# Patient Record
Sex: Female | Born: 1970
Health system: Southern US, Community
[De-identification: ages and names within clinical notes are randomized; demographics above are authoritative.]

## PROBLEM LIST (undated history)

## (undated) DIAGNOSIS — N3281 Overactive bladder: Secondary | ICD-10-CM

## (undated) DIAGNOSIS — N390 Urinary tract infection, site not specified: Secondary | ICD-10-CM

## (undated) DIAGNOSIS — E785 Hyperlipidemia, unspecified: Secondary | ICD-10-CM

## (undated) HISTORY — DX: Hyperlipidemia, unspecified: E78.5

## (undated) HISTORY — PX: OTHER SURGICAL HISTORY: SHX169

## (undated) HISTORY — DX: Overactive bladder: N32.81

## (undated) HISTORY — DX: Urinary tract infection, site not specified: N39.0

---

## 1993-10-24 LAB — HM PAP SMEAR

## 2012-10-24 LAB — HM MAMMOGRAPHY

## 2013-05-01 DIAGNOSIS — R351 Nocturia: Secondary | ICD-10-CM | POA: Diagnosis not present

## 2013-05-01 DIAGNOSIS — R32 Unspecified urinary incontinence: Secondary | ICD-10-CM | POA: Diagnosis not present

## 2013-05-01 DIAGNOSIS — R3129 Other microscopic hematuria: Secondary | ICD-10-CM | POA: Diagnosis not present

## 2013-05-08 DIAGNOSIS — Z79899 Other long term (current) drug therapy: Secondary | ICD-10-CM | POA: Diagnosis not present

## 2013-05-25 DIAGNOSIS — Z029 Encounter for administrative examinations, unspecified: Secondary | ICD-10-CM | POA: Diagnosis not present

## 2013-05-25 DIAGNOSIS — Z111 Encounter for screening for respiratory tuberculosis: Secondary | ICD-10-CM | POA: Diagnosis not present

## 2013-06-15 DIAGNOSIS — B351 Tinea unguium: Secondary | ICD-10-CM | POA: Diagnosis not present

## 2013-06-15 DIAGNOSIS — R262 Difficulty in walking, not elsewhere classified: Secondary | ICD-10-CM | POA: Diagnosis not present

## 2013-06-17 DIAGNOSIS — R3129 Other microscopic hematuria: Secondary | ICD-10-CM | POA: Diagnosis not present

## 2013-06-17 DIAGNOSIS — R32 Unspecified urinary incontinence: Secondary | ICD-10-CM | POA: Diagnosis not present

## 2013-06-18 DIAGNOSIS — Z124 Encounter for screening for malignant neoplasm of cervix: Secondary | ICD-10-CM | POA: Diagnosis not present

## 2013-06-18 DIAGNOSIS — Z1151 Encounter for screening for human papillomavirus (HPV): Secondary | ICD-10-CM | POA: Diagnosis not present

## 2013-06-18 DIAGNOSIS — Z01419 Encounter for gynecological examination (general) (routine) without abnormal findings: Secondary | ICD-10-CM | POA: Diagnosis not present

## 2013-07-24 DIAGNOSIS — R3129 Other microscopic hematuria: Secondary | ICD-10-CM | POA: Diagnosis not present

## 2013-07-30 DIAGNOSIS — R3129 Other microscopic hematuria: Secondary | ICD-10-CM | POA: Diagnosis not present

## 2013-07-30 DIAGNOSIS — K7689 Other specified diseases of liver: Secondary | ICD-10-CM | POA: Diagnosis not present

## 2013-07-30 DIAGNOSIS — N859 Noninflammatory disorder of uterus, unspecified: Secondary | ICD-10-CM | POA: Diagnosis not present

## 2013-07-30 DIAGNOSIS — N281 Cyst of kidney, acquired: Secondary | ICD-10-CM | POA: Diagnosis not present

## 2013-07-31 DIAGNOSIS — Z1231 Encounter for screening mammogram for malignant neoplasm of breast: Secondary | ICD-10-CM | POA: Diagnosis not present

## 2013-08-12 DIAGNOSIS — R319 Hematuria, unspecified: Secondary | ICD-10-CM | POA: Diagnosis not present

## 2013-09-14 DIAGNOSIS — R262 Difficulty in walking, not elsewhere classified: Secondary | ICD-10-CM | POA: Diagnosis not present

## 2013-09-14 DIAGNOSIS — B351 Tinea unguium: Secondary | ICD-10-CM | POA: Diagnosis not present

## 2013-10-14 DIAGNOSIS — R3129 Other microscopic hematuria: Secondary | ICD-10-CM | POA: Diagnosis not present

## 2013-10-14 DIAGNOSIS — F980 Enuresis not due to a substance or known physiological condition: Secondary | ICD-10-CM | POA: Diagnosis not present

## 2013-12-09 DIAGNOSIS — L299 Pruritus, unspecified: Secondary | ICD-10-CM | POA: Diagnosis not present

## 2014-02-03 DIAGNOSIS — N10 Acute tubulo-interstitial nephritis: Secondary | ICD-10-CM | POA: Diagnosis present

## 2014-02-03 DIAGNOSIS — Z466 Encounter for fitting and adjustment of urinary device: Secondary | ICD-10-CM | POA: Diagnosis not present

## 2014-02-03 DIAGNOSIS — J302 Other seasonal allergic rhinitis: Secondary | ICD-10-CM | POA: Diagnosis present

## 2014-02-03 DIAGNOSIS — N1339 Other hydronephrosis: Secondary | ICD-10-CM | POA: Diagnosis not present

## 2014-02-03 DIAGNOSIS — F70 Mild intellectual disabilities: Secondary | ICD-10-CM | POA: Diagnosis present

## 2014-02-03 DIAGNOSIS — N3090 Cystitis, unspecified without hematuria: Secondary | ICD-10-CM | POA: Diagnosis present

## 2014-02-03 DIAGNOSIS — R569 Unspecified convulsions: Secondary | ICD-10-CM | POA: Diagnosis present

## 2014-02-03 DIAGNOSIS — E876 Hypokalemia: Secondary | ICD-10-CM | POA: Diagnosis present

## 2014-02-03 DIAGNOSIS — D72829 Elevated white blood cell count, unspecified: Secondary | ICD-10-CM | POA: Diagnosis not present

## 2014-02-03 DIAGNOSIS — E780 Pure hypercholesterolemia: Secondary | ICD-10-CM | POA: Diagnosis present

## 2014-02-03 DIAGNOSIS — B9620 Unspecified Escherichia coli [E. coli] as the cause of diseases classified elsewhere: Secondary | ICD-10-CM | POA: Diagnosis present

## 2014-02-03 DIAGNOSIS — Z9889 Other specified postprocedural states: Secondary | ICD-10-CM | POA: Diagnosis not present

## 2014-02-03 DIAGNOSIS — N3000 Acute cystitis without hematuria: Secondary | ICD-10-CM | POA: Diagnosis not present

## 2014-02-03 DIAGNOSIS — N12 Tubulo-interstitial nephritis, not specified as acute or chronic: Secondary | ICD-10-CM | POA: Diagnosis not present

## 2014-02-03 DIAGNOSIS — L299 Pruritus, unspecified: Secondary | ICD-10-CM | POA: Diagnosis not present

## 2014-02-03 DIAGNOSIS — N390 Urinary tract infection, site not specified: Secondary | ICD-10-CM | POA: Diagnosis not present

## 2014-02-03 DIAGNOSIS — R1012 Left upper quadrant pain: Secondary | ICD-10-CM | POA: Diagnosis not present

## 2014-02-03 DIAGNOSIS — Z87440 Personal history of urinary (tract) infections: Secondary | ICD-10-CM | POA: Diagnosis not present

## 2014-02-03 DIAGNOSIS — N852 Hypertrophy of uterus: Secondary | ICD-10-CM | POA: Diagnosis not present

## 2014-02-03 DIAGNOSIS — R0602 Shortness of breath: Secondary | ICD-10-CM | POA: Diagnosis not present

## 2014-02-03 DIAGNOSIS — I10 Essential (primary) hypertension: Secondary | ICD-10-CM | POA: Diagnosis not present

## 2014-02-03 DIAGNOSIS — A419 Sepsis, unspecified organism: Secondary | ICD-10-CM | POA: Diagnosis not present

## 2014-02-03 DIAGNOSIS — R1032 Left lower quadrant pain: Secondary | ICD-10-CM | POA: Diagnosis not present

## 2014-02-03 DIAGNOSIS — A4151 Sepsis due to Escherichia coli [E. coli]: Secondary | ICD-10-CM | POA: Diagnosis not present

## 2014-02-03 DIAGNOSIS — E785 Hyperlipidemia, unspecified: Secondary | ICD-10-CM | POA: Diagnosis present

## 2014-02-03 DIAGNOSIS — R312 Other microscopic hematuria: Secondary | ICD-10-CM | POA: Diagnosis present

## 2014-02-03 DIAGNOSIS — D259 Leiomyoma of uterus, unspecified: Secondary | ICD-10-CM | POA: Diagnosis present

## 2014-02-03 DIAGNOSIS — N200 Calculus of kidney: Secondary | ICD-10-CM | POA: Diagnosis not present

## 2014-02-03 DIAGNOSIS — R1937 Generalized abdominal rigidity: Secondary | ICD-10-CM | POA: Diagnosis not present

## 2014-02-03 DIAGNOSIS — Z743 Need for continuous supervision: Secondary | ICD-10-CM | POA: Diagnosis not present

## 2014-02-03 DIAGNOSIS — R3915 Urgency of urination: Secondary | ICD-10-CM | POA: Diagnosis not present

## 2014-02-03 DIAGNOSIS — R109 Unspecified abdominal pain: Secondary | ICD-10-CM | POA: Diagnosis not present

## 2014-02-03 DIAGNOSIS — R197 Diarrhea, unspecified: Secondary | ICD-10-CM | POA: Diagnosis not present

## 2014-02-03 DIAGNOSIS — N1330 Unspecified hydronephrosis: Secondary | ICD-10-CM | POA: Diagnosis not present

## 2014-02-03 DIAGNOSIS — D649 Anemia, unspecified: Secondary | ICD-10-CM | POA: Diagnosis present

## 2014-02-03 DIAGNOSIS — N289 Disorder of kidney and ureter, unspecified: Secondary | ICD-10-CM | POA: Diagnosis not present

## 2014-03-01 DIAGNOSIS — N12 Tubulo-interstitial nephritis, not specified as acute or chronic: Secondary | ICD-10-CM | POA: Diagnosis not present

## 2014-03-01 DIAGNOSIS — Z09 Encounter for follow-up examination after completed treatment for conditions other than malignant neoplasm: Secondary | ICD-10-CM | POA: Diagnosis not present

## 2014-03-12 DIAGNOSIS — N1330 Unspecified hydronephrosis: Secondary | ICD-10-CM | POA: Diagnosis not present

## 2014-03-12 DIAGNOSIS — N39498 Other specified urinary incontinence: Secondary | ICD-10-CM | POA: Diagnosis not present

## 2014-04-16 DIAGNOSIS — R262 Difficulty in walking, not elsewhere classified: Secondary | ICD-10-CM | POA: Diagnosis not present

## 2014-04-16 DIAGNOSIS — B351 Tinea unguium: Secondary | ICD-10-CM | POA: Diagnosis not present

## 2014-04-16 DIAGNOSIS — L97521 Non-pressure chronic ulcer of other part of left foot limited to breakdown of skin: Secondary | ICD-10-CM | POA: Diagnosis not present

## 2014-05-10 DIAGNOSIS — Z79899 Other long term (current) drug therapy: Secondary | ICD-10-CM | POA: Diagnosis not present

## 2014-05-17 DIAGNOSIS — N1330 Unspecified hydronephrosis: Secondary | ICD-10-CM | POA: Diagnosis not present

## 2014-05-27 DIAGNOSIS — Z3042 Encounter for surveillance of injectable contraceptive: Secondary | ICD-10-CM | POA: Diagnosis not present

## 2014-05-31 DIAGNOSIS — E782 Mixed hyperlipidemia: Secondary | ICD-10-CM | POA: Diagnosis not present

## 2014-05-31 DIAGNOSIS — L299 Pruritus, unspecified: Secondary | ICD-10-CM | POA: Diagnosis not present

## 2014-05-31 DIAGNOSIS — Z111 Encounter for screening for respiratory tuberculosis: Secondary | ICD-10-CM | POA: Diagnosis not present

## 2014-05-31 DIAGNOSIS — L853 Xerosis cutis: Secondary | ICD-10-CM | POA: Diagnosis not present

## 2014-06-25 LAB — HM PAP SMEAR

## 2014-07-01 DIAGNOSIS — E782 Mixed hyperlipidemia: Secondary | ICD-10-CM | POA: Diagnosis not present

## 2014-07-01 DIAGNOSIS — Z01419 Encounter for gynecological examination (general) (routine) without abnormal findings: Secondary | ICD-10-CM | POA: Diagnosis not present

## 2014-07-01 LAB — LIPID PANEL
Cholesterol: 171 mg/dL (ref 0–200)
Cholesterol: 171 mg/dL (ref 0–200)
HDL: 52 mg/dL (ref 35–70)
HDL: 52 mg/dL (ref 35–70)
LDL Cholesterol: 110 mg/dL
LDL Cholesterol: 110 mg/dL
Triglycerides: 47 mg/dL (ref 40–160)

## 2014-07-01 LAB — HEPATIC FUNCTION PANEL
ALT: 13 U/L (ref 7–35)
ALT: 13 U/L (ref 7–35)
AST: 16 U/L (ref 13–35)
AST: 16 U/L (ref 13–35)
Alkaline Phosphatase: 74 U/L (ref 25–125)
Alkaline Phosphatase: 74 U/L (ref 25–125)
Bilirubin, Total: 1.4 mg/dL

## 2014-07-01 LAB — BASIC METABOLIC PANEL
BUN: 11 mg/dL (ref 4–21)
Creatinine: 1.2 mg/dL — AB (ref <=1.1)
Glucose: 102 mg/dL
Potassium: 4.1 mmol/L (ref 3.4–5.3)
Sodium: 141 mmol/L (ref 137–147)

## 2014-07-01 LAB — CBC AND DIFFERENTIAL
HCT: 38 % (ref 36–46)
HCT: 38 % (ref 36–46)
Hemoglobin: 12.6 g/dL (ref 12.0–16.0)
Hemoglobin: 12.6 g/dL (ref 12.0–16.0)
Platelets: 268 10*3/uL (ref 150–399)
Platelets: 268 10*3/uL (ref 150–399)
WBC: 5.7 10^3/mL
WBC: 5.7 10^3/mL

## 2014-07-23 DIAGNOSIS — R262 Difficulty in walking, not elsewhere classified: Secondary | ICD-10-CM | POA: Diagnosis not present

## 2014-07-23 DIAGNOSIS — B351 Tinea unguium: Secondary | ICD-10-CM | POA: Diagnosis not present

## 2014-08-12 DIAGNOSIS — Z3042 Encounter for surveillance of injectable contraceptive: Secondary | ICD-10-CM | POA: Diagnosis not present

## 2014-08-20 DIAGNOSIS — Z1231 Encounter for screening mammogram for malignant neoplasm of breast: Secondary | ICD-10-CM | POA: Diagnosis not present

## 2014-09-01 DIAGNOSIS — L299 Pruritus, unspecified: Secondary | ICD-10-CM | POA: Diagnosis not present

## 2014-09-24 DIAGNOSIS — R312 Other microscopic hematuria: Secondary | ICD-10-CM | POA: Diagnosis not present

## 2014-09-24 DIAGNOSIS — N39498 Other specified urinary incontinence: Secondary | ICD-10-CM | POA: Diagnosis not present

## 2014-09-24 DIAGNOSIS — R351 Nocturia: Secondary | ICD-10-CM | POA: Diagnosis not present

## 2014-10-28 DIAGNOSIS — B351 Tinea unguium: Secondary | ICD-10-CM | POA: Diagnosis not present

## 2014-10-28 DIAGNOSIS — R262 Difficulty in walking, not elsewhere classified: Secondary | ICD-10-CM | POA: Diagnosis not present

## 2014-11-04 DIAGNOSIS — Z3042 Encounter for surveillance of injectable contraceptive: Secondary | ICD-10-CM | POA: Diagnosis not present

## 2014-11-26 DIAGNOSIS — R350 Frequency of micturition: Secondary | ICD-10-CM | POA: Diagnosis not present

## 2014-11-26 DIAGNOSIS — N39498 Other specified urinary incontinence: Secondary | ICD-10-CM | POA: Diagnosis not present

## 2014-12-30 DIAGNOSIS — B351 Tinea unguium: Secondary | ICD-10-CM | POA: Diagnosis not present

## 2014-12-30 DIAGNOSIS — R262 Difficulty in walking, not elsewhere classified: Secondary | ICD-10-CM | POA: Diagnosis not present

## 2015-03-02 ENCOUNTER — Encounter: Payer: Self-pay | Admitting: *Deleted

## 2015-03-03 ENCOUNTER — Encounter: Payer: Self-pay | Admitting: *Deleted

## 2015-03-03 ENCOUNTER — Ambulatory Visit (INDEPENDENT_AMBULATORY_CARE_PROVIDER_SITE_OTHER): Payer: Medicare Other | Admitting: Nurse Practitioner

## 2015-03-03 ENCOUNTER — Encounter: Payer: Self-pay | Admitting: Nurse Practitioner

## 2015-03-03 VITALS — BP 142/80 | HR 104 | Temp 98.1°F | Resp 20 | Ht 63.78 in | Wt 189.8 lb

## 2015-03-03 DIAGNOSIS — E785 Hyperlipidemia, unspecified: Secondary | ICD-10-CM

## 2015-03-03 DIAGNOSIS — F79 Unspecified intellectual disabilities: Secondary | ICD-10-CM | POA: Diagnosis not present

## 2015-03-03 DIAGNOSIS — N3281 Overactive bladder: Secondary | ICD-10-CM

## 2015-03-03 DIAGNOSIS — D509 Iron deficiency anemia, unspecified: Secondary | ICD-10-CM | POA: Diagnosis not present

## 2015-03-03 DIAGNOSIS — J309 Allergic rhinitis, unspecified: Secondary | ICD-10-CM | POA: Diagnosis not present

## 2015-03-03 MED ORDER — MEDROXYPROGESTERONE ACETATE 150 MG/ML IM SUSP: 150.0000 mg | INTRAMUSCULAR | Status: DC

## 2015-03-03 MED ORDER — LORATADINE 10 MG PO TABS: 10.0000 mg | ORAL_TABLET | Freq: Every day | ORAL | Status: DC | PRN

## 2015-03-03 MED ORDER — SOLIFENACIN SUCCINATE 5 MG PO TABS: 5.0000 mg | ORAL_TABLET | Freq: Every day | ORAL | Status: DC

## 2015-03-03 MED ORDER — FERROUS SULFATE 325 (65 FE) MG PO TABS: 325.0000 mg | ORAL_TABLET | Freq: Three times a day (TID) | ORAL | Status: DC

## 2015-03-03 MED ORDER — PRAVASTATIN SODIUM 40 MG PO TABS: 40.0000 mg | ORAL_TABLET | Freq: Every day | ORAL | Status: DC

## 2015-03-03 NOTE — Patient Instructions (Signed)
Follow up in 4 weeks for extended visit-  With fasting lab work prior to visit

## 2015-03-03 NOTE — Progress Notes (Signed)
Patient ID: Christine Terrell, female   DOB: Mar 12, 1971, 44 y.o.   MRN: [id number]    PCP: Lauree Chandler, NP  Advanced Directive information Does patient have an advance directive?: Yes, Type of Advance Directive: Healthcare Power of Attorney  No Known Allergies  Chief Complaint  Patient presents with  . Establish Care    mother in attendance  . Medical Management of Chronic Issues     HPI: Patient is a 44 y.o. female seen in the office today to establish care. Pt with pmh of itching, hyperlipidemia, seasonal allergies, intellectual disability  Pt here with mother. Pt previously living in a supervised home in [place]. Mother moved to [facility] and pt moved with her last month. Pt currently lives with mother at this time.  Uses Claritin as needed for allergies Eating heart heathy diet Getting evaluated by psychologist due intellectual disability, this was the diagnosed give to her as a child.  No acute issues today has complaints.   Review of Systems:  Review of Systems  Constitutional: Negative for activity change, appetite change, fatigue and unexpected weight change.  HENT: Negative for congestion and hearing loss.   Eyes: Negative.   Respiratory: Negative for cough and shortness of breath.   Cardiovascular: Negative for chest pain, palpitations and leg swelling.  Gastrointestinal: Negative for abdominal pain, diarrhea and constipation.  Genitourinary: Negative for dysuria and difficulty urinating.  Musculoskeletal: Negative for myalgias and arthralgias.  Skin: Negative for color change and wound.  Neurological: Negative for dizziness and weakness.  Psychiatric/Behavioral: Negative for behavioral problems, confusion and agitation.    Past Medical History  Diagnosis Date  . Hyperlipidemia   . Recurrent UTI   . Overactive bladder    Past Surgical History  Procedure Laterality Date  . Uretral stent     Social History:   reports that she has never smoked. She has never used  smokeless tobacco. She reports that she does not drink alcohol or use illicit drugs.  Family History  Problem Relation Age of Onset  . Hypertension Mother   . Arthritis Mother   . Cancer Father 70    stomach    Medications: Patient's Medications  New Prescriptions   LORATADINE (CLARITIN) 10 MG TABLET    Take 1 tablet (10 mg total) by mouth daily as needed for allergies.   MEDROXYPROGESTERONE (DEPO-PROVERA) 150 MG/ML INJECTION    Inject 1 mL (150 mg total) into the muscle every 3 (three) months.  Previous Medications   CHOLECALCIFEROL (D3 SUPER STRENGTH) 2000 UNITS CAPS    Take 2,000 Units by mouth daily.   MULTIVITAMIN-IRON-MINERALS-FOLIC ACID (CENTRUM) CHEWABLE TABLET    Chew 2 tablets by mouth daily.  Modified Medications   Modified Medication Previous Medication   FERROUS SULFATE 325 (65 FE) MG TABLET ferrous sulfate 325 (65 FE) MG tablet      Take 1 tablet (325 mg total) by mouth 3 (three) times daily with meals.    Take 325 mg by mouth 3 (three) times daily with meals.   PRAVASTATIN (PRAVACHOL) 40 MG TABLET pravastatin (PRAVACHOL) 40 MG tablet      Take 1 tablet (40 mg total) by mouth daily.    Take 40 mg by mouth daily.   SOLIFENACIN (VESICARE) 5 MG TABLET solifenacin (VESICARE) 5 MG tablet      Take 1 tablet (5 mg total) by mouth daily.    Take 5 mg by mouth daily.  Discontinued Medications   No medications on file  Physical Exam:  Filed Vitals:   03/03/15 0902  BP: 142/80  Pulse: 104  Temp: 98.1 F (36.7 C)  TempSrc: Oral  Resp: 20  Height: 5' 3.78" (1.62 m)  Weight: 189 lb 12.8 oz (86.093 kg)  SpO2: 99%   Body mass index is 32.8 kg/(m^2).  Physical Exam  Constitutional: She is oriented to person, place, and time. She appears well-developed and well-nourished. No distress.  HENT:  Head: Normocephalic and atraumatic.  Mouth/Throat: Oropharynx is clear and moist. No oropharyngeal exudate.  Eyes: Conjunctivae are normal. Pupils are equal, round, and  reactive to light.  Neck: Normal range of motion. Neck supple.  Cardiovascular: Normal rate, regular rhythm and normal heart sounds.   Pulmonary/Chest: Effort normal and breath sounds normal.  Abdominal: Soft. Bowel sounds are normal.  Musculoskeletal: She exhibits no edema or tenderness.  Neurological: She is alert and oriented to person, place, and time.  Skin: Skin is warm and dry. She is not diaphoretic.  Psychiatric: She has a normal mood and affect.    Labs reviewed: Basic Metabolic Panel: No results for input(s): NA, K, CL, CO2, GLUCOSE, BUN, CREATININE, CALCIUM, MG, PHOS, TSH in the last 8760 hours. Liver Function Tests: No results for input(s): AST, ALT, ALKPHOS, BILITOT, PROT, ALBUMIN in the last 8760 hours. No results for input(s): LIPASE, AMYLASE in the last 8760 hours. No results for input(s): AMMONIA in the last 8760 hours. CBC: No results for input(s): WBC, NEUTROABS, HGB, HCT, MCV, PLT in the last 8760 hours. Lipid Panel: No results for input(s): CHOL, HDL, LDLCALC, TRIG, CHOLHDL, LDLDIRECT in the last 8760 hours. TSH: No results for input(s): TSH in the last 8760 hours. A1C: No results found for: HGBA1C   Assessment/Plan 1. Anemia, iron deficiency - CBC with Differential; Future to follow up anemia - ferrous sulfate 325 (65 FE) MG tablet; Take 1 tablet (325 mg total) by mouth 3 (three) times daily with meals.  Dispense: 90 tablet; Refill: 3  2. Overactive bladder -maintained on vesicare - solifenacin (VESICARE) 5 MG tablet; Take 1 tablet (5 mg total) by mouth daily.  Dispense: 30 tablet; Refill: 3  3. Hyperlipidemia -LDL elevated on last lab in April  -conts on pravachol  - Comprehensive metabolic panel; Future - Lipid panel; Future - pravastatin (PRAVACHOL) 40 MG tablet; Take 1 tablet (40 mg total) by mouth daily.  Dispense: 30 tablet; Refill: 3  4. Intellectual disability -previously at home for disabled, now living with mother and she is attempting to  get her into programs locally since they have moved. Has evaluation with psychologist next week, needs referral  - Ambulatory referral to Psychology  5. Allergic rhinitis, unspecified allergic rhinitis type -seasonal allergies, takes as needed -no problems at this time.  - loratadine (CLARITIN) 10 MG tablet; Take 1 tablet (10 mg total) by mouth daily as needed for allergies.  Follow up in 4 weeks for EV with lab work prior to visit, had last physical in April so will do extended visit but not physical   Janett Billow K. Harle Battiest  [facility] & Adult Medicine [phone] 8 am - 5 pm) [phone] (after hours)

## 2015-03-10 DIAGNOSIS — F71 Moderate intellectual disabilities: Secondary | ICD-10-CM | POA: Diagnosis not present

## 2015-03-30 ENCOUNTER — Other Ambulatory Visit: Payer: Medicare Other

## 2015-03-30 DIAGNOSIS — E785 Hyperlipidemia, unspecified: Secondary | ICD-10-CM | POA: Diagnosis not present

## 2015-03-30 DIAGNOSIS — D509 Iron deficiency anemia, unspecified: Secondary | ICD-10-CM

## 2015-03-31 ENCOUNTER — Ambulatory Visit (INDEPENDENT_AMBULATORY_CARE_PROVIDER_SITE_OTHER): Payer: Medicare Other | Admitting: Nurse Practitioner

## 2015-03-31 ENCOUNTER — Encounter: Payer: Self-pay | Admitting: Nurse Practitioner

## 2015-03-31 VITALS — BP 146/84 | HR 96 | Temp 98.4°F | Resp 12 | Ht 64.0 in | Wt 183.0 lb

## 2015-03-31 DIAGNOSIS — Z308 Encounter for other contraceptive management: Secondary | ICD-10-CM | POA: Diagnosis not present

## 2015-03-31 DIAGNOSIS — D509 Iron deficiency anemia, unspecified: Secondary | ICD-10-CM | POA: Diagnosis not present

## 2015-03-31 DIAGNOSIS — R030 Elevated blood-pressure reading, without diagnosis of hypertension: Secondary | ICD-10-CM | POA: Diagnosis not present

## 2015-03-31 DIAGNOSIS — R739 Hyperglycemia, unspecified: Secondary | ICD-10-CM

## 2015-03-31 DIAGNOSIS — F79 Unspecified intellectual disabilities: Secondary | ICD-10-CM

## 2015-03-31 DIAGNOSIS — E785 Hyperlipidemia, unspecified: Secondary | ICD-10-CM

## 2015-03-31 DIAGNOSIS — Z23 Encounter for immunization: Secondary | ICD-10-CM | POA: Diagnosis not present

## 2015-03-31 LAB — COMPREHENSIVE METABOLIC PANEL
ALT: 12 IU/L (ref 0–32)
AST: 11 IU/L (ref 0–40)
Albumin/Globulin Ratio: 1.5 (ref 1.1–2.5)
Albumin: 4.2 g/dL (ref 3.5–5.5)
Alkaline Phosphatase: 79 IU/L (ref 39–117)
BUN/Creatinine Ratio: 9 (ref 9–23)
BUN: 11 mg/dL (ref 6–24)
Bilirubin Total: 1.7 mg/dL — ABNORMAL HIGH (ref 0.0–1.2)
CO2: 22 mmol/L (ref 18–29)
Calcium: 10 mg/dL (ref 8.7–10.2)
Chloride: 103 mmol/L (ref 96–106)
Creatinine, Ser: 1.23 mg/dL — ABNORMAL HIGH (ref 0.57–1.00)
GFR calc Af Amer: 63 mL/min/{1.73_m2} (ref >59)
GFR calc non Af Amer: 54 mL/min/{1.73_m2} — ABNORMAL LOW (ref >59)
Globulin, Total: 2.8 g/dL (ref 1.5–4.5)
Glucose: 132 mg/dL — ABNORMAL HIGH (ref 65–99)
Potassium: 3.6 mmol/L (ref 3.5–5.2)
Sodium: 142 mmol/L (ref 134–144)
Total Protein: 7 g/dL (ref 6.0–8.5)

## 2015-03-31 LAB — LIPID PANEL
Chol/HDL Ratio: 3.5 ratio units (ref 0.0–4.4)
Cholesterol, Total: 173 mg/dL (ref 100–199)
HDL: 49 mg/dL (ref >39)
LDL Calculated: 113 mg/dL — ABNORMAL HIGH (ref 0–99)
Triglycerides: 54 mg/dL (ref 0–149)
VLDL Cholesterol Cal: 11 mg/dL (ref 5–40)

## 2015-03-31 LAB — CBC WITH DIFFERENTIAL/PLATELET
Basophils Absolute: 0 10*3/uL (ref 0.0–0.2)
Basos: 0 %
EOS (ABSOLUTE): 0 10*3/uL (ref 0.0–0.4)
Eos: 0 %
Hematocrit: 38.8 % (ref 34.0–46.6)
Hemoglobin: 12.9 g/dL (ref 11.1–15.9)
Immature Grans (Abs): 0 10*3/uL (ref 0.0–0.1)
Immature Granulocytes: 0 %
Lymphocytes Absolute: 1.9 10*3/uL (ref 0.7–3.1)
Lymphs: 35 %
MCH: 35.4 pg — ABNORMAL HIGH (ref 26.6–33.0)
MCHC: 33.2 g/dL (ref 31.5–35.7)
MCV: 107 fL — ABNORMAL HIGH (ref 79–97)
Monocytes Absolute: 0.6 10*3/uL (ref 0.1–0.9)
Monocytes: 10 %
Neutrophils Absolute: 3 10*3/uL (ref 1.4–7.0)
Neutrophils: 55 %
Platelets: 266 10*3/uL (ref 150–379)
RBC: 3.64 x10E6/uL — ABNORMAL LOW (ref 3.77–5.28)
RDW: 13.1 % (ref 12.3–15.4)
WBC: 5.6 10*3/uL (ref 3.4–10.8)

## 2015-03-31 NOTE — Patient Instructions (Addendum)
Will refer to gyn  Stop iron- will recheck blood prior to next visit   To follow up in 3 months with Dr Eulas Post with blood work prior to visit   Follow up with Janett Billow in 6 months for physical   Blood pressure is borderline high, 3 readings over 140/80 is hypertension Low sodium diet Increase exercise to help with this May take blood pressure outside of office and record. Bring to next visit

## 2015-03-31 NOTE — Progress Notes (Signed)
Patient ID: Christine Terrell, female   DOB: [birth year], 45 y.o.   MRN: [id number]    PCP: Lauree Chandler, NP  No Known Allergies  Chief Complaint  Patient presents with  . Referral    GYN referral   . Immunizations    Flu vacine today, refused TDaP   . Medical Management of Chronic Issues    copy of labs printed     HPI: Patient is a 45 y.o. female seen in the office today for routine follow up. Pt with pmh of itching, hyperlipidemia, seasonal allergies, intellectual disability  Pt here with mother. Last physical in April. Needs referral to GYN, light bleeding today.  Mother reports she has changed her diet, more fruits and vegetables. Eating more whole grains. Low fat diet.   Review of Systems:  Review of Systems  Constitutional: Negative for activity change, appetite change, fatigue and unexpected weight change.  HENT: Negative for congestion and hearing loss.   Eyes: Negative.   Respiratory: Negative for cough and shortness of breath.   Cardiovascular: Negative for chest pain, palpitations and leg swelling.  Gastrointestinal: Negative for abdominal pain, diarrhea and constipation.  Genitourinary: Negative for dysuria and difficulty urinating.  Musculoskeletal: Negative for myalgias and arthralgias.  Skin: Negative for color change and wound.  Neurological: Negative for dizziness and weakness.  Psychiatric/Behavioral: Negative for behavioral problems, confusion and agitation.    Past Medical History  Diagnosis Date  . Hyperlipidemia   . Recurrent UTI   . Overactive bladder    Past Surgical History  Procedure Laterality Date  . Uretral stent     Social History:   reports that she has never smoked. She has never used smokeless tobacco. She reports that she does not drink alcohol or use illicit drugs.  Family History  Problem Relation Age of Onset  . Hypertension Mother   . Arthritis Mother   . Cancer Father 53    stomach    Medications: Patient's Medications    New Prescriptions   No medications on file  Previous Medications   CHOLECALCIFEROL (D3 SUPER STRENGTH) 2000 UNITS CAPS    Take 2,000 Units by mouth daily.   FERROUS SULFATE 325 (65 FE) MG TABLET    Take 1 tablet (325 mg total) by mouth 3 (three) times daily with meals.   MEDROXYPROGESTERONE (DEPO-PROVERA) 150 MG/ML INJECTION    Inject 1 mL (150 mg total) into the muscle every 3 (three) months.   MULTIVITAMIN-IRON-MINERALS-FOLIC ACID (CENTRUM) CHEWABLE TABLET    Chew 2 tablets by mouth daily.   PRAVASTATIN (PRAVACHOL) 40 MG TABLET    Take 1 tablet (40 mg total) by mouth daily.   SOLIFENACIN (VESICARE) 5 MG TABLET    Take 1 tablet (5 mg total) by mouth daily.  Modified Medications   No medications on file  Discontinued Medications   LORATADINE (CLARITIN) 10 MG TABLET    Take 1 tablet (10 mg total) by mouth daily as needed for allergies.     Physical Exam:  Filed Vitals:   03/31/15 1342  BP: 146/84  Pulse: 96  Temp: 98.4 F (36.9 C)  TempSrc: Oral  Resp: 12  Height: 5\' 4"  (1.626 m)  Weight: 183 lb (83.008 kg)  SpO2: 97%   Body mass index is 31.4 kg/(m^2).  Physical Exam  Constitutional: She is oriented to person, place, and time. She appears well-developed and well-nourished. No distress.  HENT:  Head: Normocephalic and atraumatic.  Mouth/Throat: Oropharynx is clear and moist. No oropharyngeal exudate.  Eyes: Conjunctivae are normal. Pupils are equal, round, and reactive to light.  Neck: Normal range of motion. Neck supple.  Cardiovascular: Normal rate, regular rhythm and normal heart sounds.   Pulmonary/Chest: Effort normal and breath sounds normal.  Abdominal: Soft. Bowel sounds are normal.  Musculoskeletal: She exhibits no edema or tenderness.  Neurological: She is alert and oriented to person, place, and time.  Skin: Skin is warm and dry. She is not diaphoretic.  Psychiatric: She has a normal mood and affect.    Labs reviewed: Basic Metabolic Panel:  Recent  Labs  07/01/14 03/30/15 0851  NA 141 142  K 4.1 3.6  CL  --  103  CO2  --  22  GLUCOSE  --  132*  BUN 11 11  CREATININE 1.2* 1.23*  CALCIUM  --  10.0   Liver Function Tests:  Recent Labs  07/01/14 03/30/15 0851  AST 16 11  ALT 13 12  ALKPHOS 74 79  BILITOT  --  1.7*  PROT  --  7.0  ALBUMIN  --  4.2   No results for input(s): LIPASE, AMYLASE in the last 8760 hours. No results for input(s): AMMONIA in the last 8760 hours. CBC:  Recent Labs  07/01/14 03/30/15 0851  WBC 5.7 5.6  NEUTROABS  --  3.0  HGB 12.6  --   HCT 38 38.8  PLT 268  --    Lipid Panel:  Recent Labs  07/01/14 03/30/15 0851  CHOL 171 173  HDL 52 49  LDLCALC 110 113*  TRIG 47 54  CHOLHDL  --  3.5   TSH: No results for input(s): TSH in the last 8760 hours. A1C: No results found for: HGBA1C   Assessment/Plan  1. Encounter for other contraceptive management - Ambulatory referral to Gynecology for cervical cancer screening and management of depo-provera  2. Borderline hypertension -blood pressure may be elevated due to anxiety from being at doctors office, to take blood pressure at home and record. Bring to next visit -low sodium diet and increase physical activity.   3. Anemia, iron deficiency hgb stable, will stop iron at this time and follow up CBC prior to next visit.  - CBC with Differential; Future  4. Hyperlipidemia -worsening LDL but stable, heart healthy diet, and to cont Pravachol  - Comprehensive metabolic panel; Future - Lipid panel; Future  5. Intellectual disability -remains stable, living with mother, looking into a day program  6. Hyperglycemia -elevated blood sugar on recent labs, diet changes have been made, to lifestyle modifications - Hemoglobin A1c; Future    Patty Leitzke K. Harle Battiest  [facility] & Adult Medicine [phone] 8 am - 5 pm) [phone] (after hours)

## 2015-04-05 ENCOUNTER — Telehealth: Payer: Self-pay | Admitting: *Deleted

## 2015-04-05 NOTE — Telephone Encounter (Signed)
Mother stated that she is doing all this already and wants to know why you don't think it is coming from the flu shot. She stated that her daughter was fine when she came in and when she got the flu shot that is  when the symptoms started. Mother can't understand why you are saying this because you injected her with the flu and it is a possibility that this can cause these symptoms. Mother doesn't want a response back, will continue the Mucinex and Tylenol.

## 2015-04-05 NOTE — Telephone Encounter (Signed)
Theres nothing that can be called in, sounds viral but doubtful from influenza vaccine.  may use tylenol 650 mg every 6 hours as needed for sore throat, mucinex DM twice daily with full glass of water for cough and congestion. To really encourage increase in fluids May use warm fluids to help with sore throat.

## 2015-04-05 NOTE — Telephone Encounter (Signed)
Patient mother, Christine Terrell called and stated that her daughter has been sick since she received the flu shot Thursday and wants something called in for her to the pharmacy. Patient is having Hacking Cough, sneezing, sore throat, runny nose, congestion, no fever. Has tried OTC Cold medications with no relief. Please Advise.

## 2015-04-11 DIAGNOSIS — N920 Excessive and frequent menstruation with regular cycle: Secondary | ICD-10-CM | POA: Diagnosis not present

## 2015-04-11 DIAGNOSIS — Z30013 Encounter for initial prescription of injectable contraceptive: Secondary | ICD-10-CM | POA: Diagnosis not present

## 2015-05-04 DIAGNOSIS — F71 Moderate intellectual disabilities: Secondary | ICD-10-CM | POA: Diagnosis not present

## 2015-05-17 ENCOUNTER — Encounter: Payer: Self-pay | Admitting: *Deleted

## 2015-05-20 ENCOUNTER — Telehealth: Payer: Self-pay

## 2015-05-20 MED ORDER — LORATADINE 10 MG PO TABS: 10.0000 mg | ORAL_TABLET | Freq: Every day | ORAL | Status: DC

## 2015-05-20 NOTE — Telephone Encounter (Signed)
Discussed with patient's mother. RX sent to the pharmacy

## 2015-05-20 NOTE — Telephone Encounter (Signed)
Would not recommend her taking Claritin D every day, okay for Rx for claritin 10 mg daily

## 2015-05-20 NOTE — Telephone Encounter (Signed)
Patient is taking Claritin D OTC once daily and would like to know if she can get a rx for this so that insurance will pay for it.  Please advise

## 2015-06-13 ENCOUNTER — Other Ambulatory Visit: Payer: Self-pay | Admitting: Nurse Practitioner

## 2015-07-04 ENCOUNTER — Other Ambulatory Visit: Payer: Medicare Other

## 2015-07-04 DIAGNOSIS — R739 Hyperglycemia, unspecified: Secondary | ICD-10-CM | POA: Diagnosis not present

## 2015-07-04 DIAGNOSIS — E785 Hyperlipidemia, unspecified: Secondary | ICD-10-CM | POA: Diagnosis not present

## 2015-07-04 DIAGNOSIS — D509 Iron deficiency anemia, unspecified: Secondary | ICD-10-CM

## 2015-07-05 LAB — CBC WITH DIFFERENTIAL/PLATELET
Basophils Absolute: 0 10*3/uL (ref 0.0–0.2)
Basos: 0 %
EOS (ABSOLUTE): 0 10*3/uL (ref 0.0–0.4)
Eos: 0 %
Hematocrit: 35.6 % (ref 34.0–46.6)
Hemoglobin: 11.9 g/dL (ref 11.1–15.9)
Immature Grans (Abs): 0 10*3/uL (ref 0.0–0.1)
Immature Granulocytes: 0 %
Lymphocytes Absolute: 1.8 10*3/uL (ref 0.7–3.1)
Lymphs: 31 %
MCH: 35.6 pg — ABNORMAL HIGH (ref 26.6–33.0)
MCHC: 33.4 g/dL (ref 31.5–35.7)
MCV: 107 fL — ABNORMAL HIGH (ref 79–97)
Monocytes Absolute: 0.4 10*3/uL (ref 0.1–0.9)
Monocytes: 7 %
Neutrophils Absolute: 3.5 10*3/uL (ref 1.4–7.0)
Neutrophils: 62 %
Platelets: 321 10*3/uL (ref 150–379)
RBC: 3.34 x10E6/uL — ABNORMAL LOW (ref 3.77–5.28)
RDW: 13.2 % (ref 12.3–15.4)
WBC: 5.7 10*3/uL (ref 3.4–10.8)

## 2015-07-05 LAB — HEMOGLOBIN A1C
Est. average glucose Bld gHb Est-mCnc: 111 mg/dL
Hgb A1c MFr Bld: 5.5 % (ref 4.8–5.6)

## 2015-07-06 ENCOUNTER — Ambulatory Visit: Payer: Medicare Other | Admitting: Internal Medicine

## 2015-07-06 ENCOUNTER — Encounter: Payer: Self-pay | Admitting: Internal Medicine

## 2015-07-06 ENCOUNTER — Ambulatory Visit (INDEPENDENT_AMBULATORY_CARE_PROVIDER_SITE_OTHER): Payer: Medicare Other | Admitting: Internal Medicine

## 2015-07-06 VITALS — BP 140/98 | HR 110 | Temp 98.5°F | Resp 18 | Ht 64.0 in | Wt 176.4 lb

## 2015-07-06 DIAGNOSIS — R030 Elevated blood-pressure reading, without diagnosis of hypertension: Secondary | ICD-10-CM

## 2015-07-06 DIAGNOSIS — N3281 Overactive bladder: Secondary | ICD-10-CM | POA: Diagnosis not present

## 2015-07-06 DIAGNOSIS — F79 Unspecified intellectual disabilities: Secondary | ICD-10-CM | POA: Insufficient documentation

## 2015-07-06 DIAGNOSIS — J309 Allergic rhinitis, unspecified: Secondary | ICD-10-CM | POA: Insufficient documentation

## 2015-07-06 DIAGNOSIS — I10 Essential (primary) hypertension: Secondary | ICD-10-CM | POA: Insufficient documentation

## 2015-07-06 DIAGNOSIS — IMO0001 Reserved for inherently not codable concepts without codable children: Secondary | ICD-10-CM

## 2015-07-06 DIAGNOSIS — E785 Hyperlipidemia, unspecified: Secondary | ICD-10-CM | POA: Diagnosis not present

## 2015-07-06 NOTE — Addendum Note (Signed)
Addended by: Leonie Green on: 07/06/2015 04:40 PM   Modules accepted: Orders

## 2015-07-06 NOTE — Progress Notes (Signed)
Patient ID: Christine Terrell, female   DOB: Dec 02, 1970, 45 y.o.   MRN: [id number]    Location:    [facility]   Place of Service:  OFFICE   Chief Complaint  Patient presents with  . Medical Management of Chronic Issues    3 month follow-up routine, labs printed  . OTHER    Mother in room with patient    HPI:  45 yo female seen today for f/u. She has interview with adult daycare later this morning and feels anxious. BP at drug store runs 130s/80s usually. No readings >150/90.  She maintains healthy diet and exercise program including gym. weight down 13 lbs since Dec 2016  OAB - stable on vesicare. No urinary sx's at this time  Hyperlipidemia  Stable on pravastatin. LDL 113  Recurrent UTI hx - none reported recently  Hx iron deficiency anemia - resolved. Hgb 11.9  seasonal allergy - stable on claritin  Hyperglycemia - not diabetic. A1c 5.5%  Elevated BP - she does check at home  She takes depo-provera injections for contraception. Next injection 4/17th at [facility]  She is a poor historian due to intellect disability. Hx obtained from chart and pt's mother  Past Medical History  Diagnosis Date  . Hyperlipidemia   . Recurrent UTI   . Overactive bladder     Past Surgical History  Procedure Laterality Date  . Uretral stent      Patient Care Team: Lauree Chandler, NP as PCP - General (Geriatric Medicine)  Social History   Social History  . Marital Status: Single    Spouse Name: N/A  . Number of Children: N/A  . Years of Education: N/A   Occupational History  . Not on file.   Social History Main Topics  . Smoking status: Never Smoker   . Smokeless tobacco: Never Used  . Alcohol Use: No  . Drug Use: No  . Sexual Activity: Not on file   Other Topics Concern  . Not on file   Social History Narrative   Diet:   Do you drink/eat things with caffeine?    Marital status: Single                             What year were you married?   Do you live in a  house, apartment, assisted living, condo, trailer, etc)? House   Is it one or more stories? No   How many persons live in your home? 2   Do you have any pets in your home? No   Current or past profession: OTC Psychologist, educational)   Do you exercise?   A little                                                  Type & how often: Walking   Do you have a living will? No   Do you have a DNR Form? No   Do you have a POA/HPOA forms? No         Patient's caretaker reports unprotected sexual intercourse with multiple partners.      reports that she has never smoked. She has never used smokeless tobacco. She reports that she does not drink alcohol or use illicit drugs.  No Known Allergies  Medications: Patient's Medications  New Prescriptions   No medications on file  Previous Medications   CHOLECALCIFEROL (D3 SUPER STRENGTH) 2000 UNITS CAPS    Take 2,000 Units by mouth daily.   LORATADINE (CLARITIN) 10 MG TABLET    Take 1 tablet (10 mg total) by mouth daily.   MEDROXYPROGESTERONE (DEPO-PROVERA) 150 MG/ML INJECTION    Inject 1 mL (150 mg total) into the muscle every 3 (three) months.   MULTIVITAMIN-IRON-MINERALS-FOLIC ACID (CENTRUM) CHEWABLE TABLET    Chew 2 tablets by mouth daily.   PRAVASTATIN (PRAVACHOL) 40 MG TABLET    TAKE 1 TABLET (40 MG TOTAL) BY MOUTH DAILY.   SOLIFENACIN (VESICARE) 5 MG TABLET    Take 1 tablet (5 mg total) by mouth daily.  Modified Medications   No medications on file  Discontinued Medications   FERROUS SULFATE 325 (65 FE) MG TABLET    Take 1 tablet (325 mg total) by mouth 3 (three) times daily with meals.    Review of Systems  Unable to perform ROS: Psychiatric disorder  mentally challenged  Filed Vitals:   07/06/15 0826  Pulse: 110  Temp: 98.5 F (36.9 C)  TempSrc: Oral  Resp: 18  Height: 5\' 4"  (1.626 m)  Weight: 176 lb 6.4 oz (80.015 kg)  SpO2: 98%   Body mass index is 30.26 kg/(m^2).  Physical Exam  Constitutional: She appears well-developed  and well-nourished.  HENT:  Mouth/Throat: Oropharynx is clear and moist. No oropharyngeal exudate.  Eyes: Pupils are equal, round, and reactive to light. No scleral icterus.  Neck: Neck supple. Carotid bruit is not present. No tracheal deviation present. No thyromegaly present.  Cardiovascular: Normal rate, regular rhythm, normal heart sounds and intact distal pulses.  Exam reveals no gallop and no friction rub.   No murmur heard. No LE edema b/l. no calf TTP.   Pulmonary/Chest: Effort normal and breath sounds normal. No stridor. No respiratory distress. She has no wheezes. She has no rales.  Abdominal: Soft. Bowel sounds are normal. She exhibits no distension and no mass. There is no hepatomegaly. There is no tenderness. There is no rebound and no guarding.  Musculoskeletal: She exhibits edema.  Lymphadenopathy:    She has no cervical adenopathy.  Neurological: She is alert.  Skin: Skin is warm and dry. No rash noted.  Psychiatric: She has a normal mood and affect. Her behavior is normal.     Labs reviewed: Appointment on 07/04/2015  Component Date Value Ref Range Status  . WBC 07/04/2015 5.7  3.4 - 10.8 x10E3/uL Final  . RBC 07/04/2015 3.34* 3.77 - 5.28 x10E6/uL Final  . Hemoglobin 07/04/2015 11.9  11.1 - 15.9 g/dL Final  . Hematocrit 07/04/2015 35.6  34.0 - 46.6 % Final  . MCV 07/04/2015 107* 79 - 97 fL Final  . MCH 07/04/2015 35.6* 26.6 - 33.0 pg Final  . MCHC 07/04/2015 33.4  31.5 - 35.7 g/dL Final  . RDW 07/04/2015 13.2  12.3 - 15.4 % Final  . Platelets 07/04/2015 321  150 - 379 x10E3/uL Final  . Neutrophils 07/04/2015 62   Final  . Lymphs 07/04/2015 31   Final  . Monocytes 07/04/2015 7   Final  . Eos 07/04/2015 0   Final  . Basos 07/04/2015 0   Final  . Neutrophils Absolute 07/04/2015 3.5  1.4 - 7.0 x10E3/uL Final  . Lymphocytes Absolute 07/04/2015 1.8  0.7 - 3.1 x10E3/uL Final  . Monocytes Absolute 07/04/2015 0.4  0.1 - 0.9 x10E3/uL Final  . EOS (ABSOLUTE) 07/04/2015  0.0  0.0 - 0.4 x10E3/uL Final  . Basophils Absolute 07/04/2015 0.0  0.0 - 0.2 x10E3/uL Final  . Immature Granulocytes 07/04/2015 0   Final  . Immature Grans (Abs) 07/04/2015 0.0  0.0 - 0.1 x10E3/uL Final  . Hgb A1c MFr Bld 07/04/2015 5.5  4.8 - 5.6 % Final   Comment:          Pre-diabetes: 5.7 - 6.4          Diabetes: >6.4          Glycemic control for adults with diabetes: <7.0   . Est. average glucose Bld gHb Est-m* 07/04/2015 111   Final  Abstract on 05/17/2015  Component Date Value Ref Range Status  . HM Pap smear 06/25/2014 Pap Smear done in April 2016 was normal per [facility]   Final    No results found.   Assessment/Plan   ICD-9-CM ICD-10-CM   1. Elevated blood pressure 796.2 R03.0   2. Hyperlipidemia 272.4 E78.5   3. Intellectual disability 319 F79   4. Overactive bladder 596.51 N32.81   5. Allergic rhinitis, unspecified allergic rhinitis type 477.9 J30.9    Ok to hold vesicare to determine if overactive bladder symptoms resolved. If urinary frequency/urgency or incontinence returns, please resume medicine  Continue other medications as ordered  continue diet and exercise program  Follow up in 3 mos for BP and hyperlipidemia f/u. Check fasting labs prior to appt  [facility] S. Perlie Gold  [facility] and Adult Medicine [address] [place], [place] [address] [phone] Cell (Monday-Friday 8 AM - 5 PM) [phone] After 5 PM and follow prompts

## 2015-07-06 NOTE — Patient Instructions (Addendum)
Ok to hold vesicare to determine if overactive bladder symptoms resolved. If urinary frequency/urgency or incontinence returns, please resume medicine  Continue other medications as ordered  continue diet and exercise program  Follow up in 3 mos for BP and hyperlipidemia f/u. Check fasting labs prior to appt

## 2015-07-07 LAB — COMPREHENSIVE METABOLIC PANEL
ALT: 20 IU/L (ref 0–32)
AST: 28 IU/L (ref 0–40)
Albumin/Globulin Ratio: 1.2 (ref 1.2–2.2)
Albumin: 3.8 g/dL (ref 3.5–5.5)
Alkaline Phosphatase: 80 IU/L (ref 39–117)
BUN/Creatinine Ratio: 11 (ref 9–23)
BUN: 12 mg/dL (ref 6–24)
Bilirubin Total: 1 mg/dL (ref 0.0–1.2)
CO2: 22 mmol/L (ref 18–29)
Calcium: 9.6 mg/dL (ref 8.7–10.2)
Chloride: 105 mmol/L (ref 96–106)
Creatinine, Ser: 1.05 mg/dL — ABNORMAL HIGH (ref 0.57–1.00)
GFR calc Af Amer: 76 mL/min/{1.73_m2} (ref >59)
GFR calc non Af Amer: 66 mL/min/{1.73_m2} (ref >59)
Globulin, Total: 3.2 g/dL (ref 1.5–4.5)
Glucose: 115 mg/dL — ABNORMAL HIGH (ref 65–99)
Potassium: 3.9 mmol/L (ref 3.5–5.2)
Sodium: 144 mmol/L (ref 134–144)
Total Protein: 7 g/dL (ref 6.0–8.5)

## 2015-07-07 LAB — LIPID PANEL
Chol/HDL Ratio: 3.5 ratio units (ref 0.0–4.4)
Cholesterol, Total: 149 mg/dL (ref 100–199)
HDL: 43 mg/dL (ref >39)
LDL Calculated: 98 mg/dL (ref 0–99)
Triglycerides: 39 mg/dL (ref 0–149)
VLDL Cholesterol Cal: 8 mg/dL (ref 5–40)

## 2015-07-11 DIAGNOSIS — N920 Excessive and frequent menstruation with regular cycle: Secondary | ICD-10-CM | POA: Diagnosis not present

## 2015-07-11 DIAGNOSIS — Z3042 Encounter for surveillance of injectable contraceptive: Secondary | ICD-10-CM | POA: Diagnosis not present

## 2015-07-13 ENCOUNTER — Telehealth: Payer: Self-pay | Admitting: Nurse Practitioner

## 2015-07-13 DIAGNOSIS — Z0289 Encounter for other administrative examinations: Secondary | ICD-10-CM

## 2015-07-13 NOTE — Telephone Encounter (Signed)
Ms. Christine Terrell mom dropped off a daycare form to be filled out. The form was place in the rx tray.

## 2015-07-13 NOTE — Telephone Encounter (Signed)
Routed to [facility] in Triage

## 2015-07-19 NOTE — Telephone Encounter (Signed)
Yellow chart with form placed in triage tray yesterday 07/18/2015 in the afternoon. CMA portion completed and OV note and facesheet printed and left for Dr. Eulas Post to review and sign.

## 2015-07-21 ENCOUNTER — Telehealth: Payer: Self-pay | Admitting: Internal Medicine

## 2015-07-21 NOTE — Telephone Encounter (Signed)
Patient's mom Loma Boston called on 07/20/2015 and ask about the Adult Daycare Forms that were dropped off on 07/13/2015. The forms were in Dr. Vale Haven tray. I told Dr. Eulas Post on 07/20/2015 that  the patient's mom was calling and needed those forms. Mrs. Loma Boston was upset that they were not already done. Mrs. Loma Boston called back on 07/21/2015 to see if she could come and pick up the forms. I checked and they were still in Dr. Vale Haven tray to be filled out. Mrs. Loma Boston was very upset and stated that she would be here in the morning on 07/22/2015 at 8:00 am and wants the forms to be filled out so she can pick them up.

## 2015-07-22 ENCOUNTER — Telehealth: Payer: Self-pay | Admitting: Nurse Practitioner

## 2015-07-22 NOTE — Telephone Encounter (Signed)
Patient's mom called to stress the urgency of this form completion. This needs to be received by the Adult Daycare by 4:30 today. Patient's mom was upset that the form was dropped off on 07-13-15 and not completed as of now. Patient's mother would like a call from management.   Message forwarded to Allen Norris, Glass blower/designer

## 2015-07-22 NOTE — Telephone Encounter (Signed)
Spoke with Mrs. Henderson Newcomer, patients mother.  Told her we would be working on completing the [place] form.   Told her I would call her when completed.  Cell # [phone].   cdavis .

## 2015-07-22 NOTE — Telephone Encounter (Signed)
Patients mother aware form is available for pick-up. Patients mother will call be back once she finds out if form can be faxed

## 2015-07-26 DIAGNOSIS — Z1231 Encounter for screening mammogram for malignant neoplasm of breast: Secondary | ICD-10-CM | POA: Diagnosis not present

## 2015-07-26 DIAGNOSIS — Z01419 Encounter for gynecological examination (general) (routine) without abnormal findings: Secondary | ICD-10-CM | POA: Diagnosis not present

## 2015-07-26 DIAGNOSIS — Z6830 Body mass index (BMI) 30.0-30.9, adult: Secondary | ICD-10-CM | POA: Diagnosis not present

## 2015-08-02 ENCOUNTER — Other Ambulatory Visit: Payer: Self-pay | Admitting: Obstetrics and Gynecology

## 2015-08-02 DIAGNOSIS — R928 Other abnormal and inconclusive findings on diagnostic imaging of breast: Secondary | ICD-10-CM

## 2015-08-09 ENCOUNTER — Ambulatory Visit
Admission: RE | Admit: 2015-08-09 | Discharge: 2015-08-09 | Disposition: A | Discharge status: Home / Self Care | Payer: Medicare Other | Source: Ambulatory Visit | Attending: Obstetrics and Gynecology | Admitting: Obstetrics and Gynecology

## 2015-08-09 DIAGNOSIS — R928 Other abnormal and inconclusive findings on diagnostic imaging of breast: Secondary | ICD-10-CM

## 2015-08-09 DIAGNOSIS — R921 Mammographic calcification found on diagnostic imaging of breast: Secondary | ICD-10-CM | POA: Diagnosis not present

## 2015-08-19 ENCOUNTER — Encounter: Payer: Self-pay | Admitting: Nurse Practitioner

## 2015-09-17 ENCOUNTER — Other Ambulatory Visit: Payer: Self-pay | Admitting: Nurse Practitioner

## 2015-09-28 DIAGNOSIS — N920 Excessive and frequent menstruation with regular cycle: Secondary | ICD-10-CM | POA: Diagnosis not present

## 2015-09-29 ENCOUNTER — Other Ambulatory Visit: Payer: Self-pay | Admitting: Nurse Practitioner

## 2015-09-29 DIAGNOSIS — R030 Elevated blood-pressure reading, without diagnosis of hypertension: Principal | ICD-10-CM

## 2015-09-29 DIAGNOSIS — E785 Hyperlipidemia, unspecified: Secondary | ICD-10-CM

## 2015-09-29 DIAGNOSIS — IMO0001 Reserved for inherently not codable concepts without codable children: Secondary | ICD-10-CM

## 2015-10-03 ENCOUNTER — Other Ambulatory Visit: Payer: Medicare Other

## 2015-10-03 DIAGNOSIS — R030 Elevated blood-pressure reading, without diagnosis of hypertension: Secondary | ICD-10-CM | POA: Diagnosis not present

## 2015-10-03 DIAGNOSIS — E785 Hyperlipidemia, unspecified: Secondary | ICD-10-CM | POA: Diagnosis not present

## 2015-10-03 DIAGNOSIS — IMO0001 Reserved for inherently not codable concepts without codable children: Secondary | ICD-10-CM

## 2015-10-03 LAB — BASIC METABOLIC PANEL
BUN: 13 mg/dL (ref 7–25)
CO2: 23 mmol/L (ref 20–31)
Calcium: 9.5 mg/dL (ref 8.6–10.2)
Chloride: 108 mmol/L (ref 98–110)
Creat: 1.1 mg/dL (ref 0.50–1.10)
Glucose, Bld: 117 mg/dL — ABNORMAL HIGH (ref 65–99)
Potassium: 3.6 mmol/L (ref 3.5–5.3)
Sodium: 142 mmol/L (ref 135–146)

## 2015-10-03 LAB — ALT: ALT: 14 U/L (ref 6–29)

## 2015-10-03 LAB — LIPID PANEL
Cholesterol: 148 mg/dL (ref 125–200)
HDL: 50 mg/dL (ref >=46)
LDL Cholesterol: 90 mg/dL (ref <130)
Total CHOL/HDL Ratio: 3 Ratio (ref <=5.0)
Triglycerides: 38 mg/dL (ref <150)
VLDL: 8 mg/dL (ref <30)

## 2015-10-05 ENCOUNTER — Ambulatory Visit: Payer: Medicare Other | Admitting: Internal Medicine

## 2015-10-06 ENCOUNTER — Encounter: Payer: Medicare Other | Admitting: Nurse Practitioner

## 2015-10-07 ENCOUNTER — Ambulatory Visit: Payer: Medicare Other | Admitting: Internal Medicine

## 2015-11-18 ENCOUNTER — Ambulatory Visit: Payer: Medicare Other | Admitting: Internal Medicine

## 2015-11-19 ENCOUNTER — Other Ambulatory Visit: Payer: Self-pay | Admitting: Nurse Practitioner

## 2015-12-05 ENCOUNTER — Encounter: Payer: Self-pay | Admitting: Nurse Practitioner

## 2015-12-05 ENCOUNTER — Ambulatory Visit (INDEPENDENT_AMBULATORY_CARE_PROVIDER_SITE_OTHER): Payer: Medicare Other | Admitting: Nurse Practitioner

## 2015-12-05 VITALS — BP 140/94 | HR 93 | Temp 98.2°F | Resp 17 | Ht 64.0 in | Wt 175.0 lb

## 2015-12-05 DIAGNOSIS — N3281 Overactive bladder: Secondary | ICD-10-CM

## 2015-12-05 DIAGNOSIS — R739 Hyperglycemia, unspecified: Secondary | ICD-10-CM

## 2015-12-05 DIAGNOSIS — Z23 Encounter for immunization: Secondary | ICD-10-CM | POA: Diagnosis not present

## 2015-12-05 DIAGNOSIS — I10 Essential (primary) hypertension: Secondary | ICD-10-CM

## 2015-12-05 DIAGNOSIS — E785 Hyperlipidemia, unspecified: Secondary | ICD-10-CM

## 2015-12-05 MED ORDER — HYDROCHLOROTHIAZIDE 12.5 MG PO CAPS
12.5000 mg | ORAL_CAPSULE | Freq: Every day | ORAL | 1 refills | Status: DC
Start: 2015-12-05 — End: 2016-01-30

## 2015-12-05 NOTE — Progress Notes (Signed)
Careteam: Patient Care Team: Christine Chandler, NP as PCP - General (Geriatric Medicine)  Advanced Directive information Does patient have an advance directive?: No  No Known Allergies  Chief Complaint  Patient presents with  . Medical Management of Chronic Issues    Follow up on labs.     HPI: Patient is a 45 y.o. female seen in the office today for routine follow up.   She is a poor historian due to intellect disability. Hx from pt's mother At last visit vesicare was stopped and overactive bladder symptoms did not come back. Christine Terrell is currently in a day program, not exercising now. Weight has been stable. Not eating as healthy.    Hyperlipidemia  Stable on pravastatin. Recent LDL 90. No myalgias    seasonal allergy - stable on claritin   Hyperglycemia - not diabetic. A1c 5.5% 5 months ago. She eats a lot of fruits, not a lot of sugary foods.    Blood pressure- remains elevated, has not been taking at home.    She takes depo-provera injections for contraception, being followed at [facility]    Review of Systems:  Review of Systems  Constitutional: Negative for activity change, appetite change, fatigue and unexpected weight change.  HENT: Negative for congestion and hearing loss.   Eyes: Negative.   Respiratory: Negative for cough and shortness of breath.   Cardiovascular: Negative for chest pain, palpitations and leg swelling.  Gastrointestinal: Negative for constipation and diarrhea.  Genitourinary: Negative for difficulty urinating and dysuria.  Musculoskeletal: Negative for arthralgias and myalgias.  Skin: Negative for color change and wound.  Neurological: Negative for dizziness and weakness.  Psychiatric/Behavioral: Negative for agitation, behavioral problems and confusion.    Past Medical History:  Diagnosis Date  . Hyperlipidemia   . Overactive bladder   . Recurrent UTI    Past Surgical History:  Procedure Laterality Date  . uretral  stent     Social History:   reports that she has never smoked. She has never used smokeless tobacco. She reports that she does not drink alcohol or use drugs.  Family History  Problem Relation Age of Onset  . Hypertension Mother   . Arthritis Mother   . Cancer Father 81    stomach    Medications: Patient's Medications  New Prescriptions   No medications on file  Previous Medications   CHOLECALCIFEROL (D3 SUPER STRENGTH) 2000 UNITS CAPS    Take 2,000 Units by mouth daily.   LORATADINE (CLARITIN) 10 MG TABLET    TAKE 1 TABLET (10 MG TOTAL) BY MOUTH DAILY.   MEDROXYPROGESTERONE (DEPO-PROVERA) 150 MG/ML INJECTION    Inject 1 mL (150 mg total) into the muscle every 3 (three) months.   MULTIVITAMIN-IRON-MINERALS-FOLIC ACID (CENTRUM) CHEWABLE TABLET    Chew 2 tablets by mouth daily.   PRAVASTATIN (PRAVACHOL) 40 MG TABLET    TAKE 1 TABLET (40 MG TOTAL) BY MOUTH DAILY.  Modified Medications   No medications on file  Discontinued Medications   SOLIFENACIN (VESICARE) 5 MG TABLET    Take 1 tablet (5 mg total) by mouth daily.     Physical Exam:  Vitals:   12/05/15 1441  BP: (!) 140/94  Pulse: 93  Resp: 17  Temp: 98.2 F (36.8 C)  TempSrc: Oral  SpO2: 97%  Weight: 175 lb (79.4 kg)  Height: '5\' 4"'  (1.626 m)   Body mass index is 30.04 kg/m.  Physical Exam  Constitutional: She is oriented to person, place,  and time. She appears well-developed and well-nourished. No distress.  HENT:  Head: Normocephalic and atraumatic.  Mouth/Throat: Oropharynx is clear and moist. No oropharyngeal exudate.  Eyes: Conjunctivae are normal. Pupils are equal, round, and reactive to light.  Neck: Normal range of motion. Neck supple.  Cardiovascular: Normal rate, regular rhythm and normal heart sounds.   Pulmonary/Chest: Effort normal and breath sounds normal.  Abdominal: Soft. Bowel sounds are normal.  Musculoskeletal: She exhibits no edema or tenderness.  Neurological: She is alert and oriented to  person, place, and time.  Skin: Skin is warm and dry. She is not diaphoretic.  Psychiatric: She has a normal mood and affect.    Labs reviewed: Basic Metabolic Panel:  Recent Labs  03/30/15 0851 07/04/15 1052 10/03/15 0852  NA 142 144 142  K 3.6 3.9 3.6  CL 103 105 108  CO2 '22 22 23  ' GLUCOSE 132* 115* 117*  BUN '11 12 13  ' CREATININE 1.23* 1.05* 1.10  CALCIUM 10.0 9.6 9.5   Liver Function Tests:  Recent Labs  03/30/15 0851 07/04/15 1052 10/03/15 0852  AST 11 28  --   ALT '12 20 14  ' ALKPHOS 79 80  --   BILITOT 1.7* 1.0  --   PROT 7.0 7.0  --   ALBUMIN 4.2 3.8  --    No results for input(s): LIPASE, AMYLASE in the last 8760 hours. No results for input(s): AMMONIA in the last 8760 hours. CBC:  Recent Labs  03/30/15 0851 07/04/15 1032  WBC 5.6 5.7  NEUTROABS 3.0 3.5  HCT 38.8 35.6  MCV 107* 107*  PLT 266 321   Lipid Panel:  Recent Labs  03/30/15 0851 07/04/15 1052 10/03/15 0854  CHOL 173 149 148  HDL 49 43 50  LDLCALC 113* 98 90  TRIG 54 39 38  CHOLHDL 3.5 3.5 3.0   TSH: No results for input(s): TSH in the last 8760 hours. A1C: Lab Results  Component Value Date   HGBA1C 5.5 07/04/2015     Assessment/Plan 1. Essential hypertension -remains above goal. Will start HCTZ and monitor. Cont lifestyle modifications - hydrochlorothiazide (MICROZIDE) 12.5 MG capsule; Take 1 capsule (12.5 mg total) by mouth daily.  Dispense: 30 capsule; Refill: 1 - BMP with eGFR; Future  2. Hyperlipidemia LDL at 90 in July. To cont dietary modifications as well as statin  3. Overactive bladder Has stopped vesicare, no increase in symptoms   4. Hyperglycemia A1c 5.5 in April, Glucose 117 on last labs, educated regarding low sugar diet. Will monitor at this time. Importance of dietary changes reinforced.   5. Encounter for immunization - Flu Vaccine QUAD 36+ mos IM  4 week follow up with BMP prior to visit for HTN Christine Terrell K. Christine Terrell  Christine Terrell  [facility] [phone] 8 am - 5 pm) [phone] (after hours)

## 2015-12-05 NOTE — Patient Instructions (Signed)
To start HCTZ (hydrochlorothiazide) 12.5 mg daily

## 2015-12-14 DIAGNOSIS — N920 Excessive and frequent menstruation with regular cycle: Secondary | ICD-10-CM | POA: Diagnosis not present

## 2016-01-02 ENCOUNTER — Other Ambulatory Visit: Payer: Medicare Other

## 2016-01-02 DIAGNOSIS — I10 Essential (primary) hypertension: Secondary | ICD-10-CM | POA: Diagnosis not present

## 2016-01-02 LAB — BASIC METABOLIC PANEL WITH GFR
BUN: 20 mg/dL (ref 7–25)
CO2: 27 mmol/L (ref 20–31)
Calcium: 9.9 mg/dL (ref 8.6–10.2)
Chloride: 104 mmol/L (ref 98–110)
Creat: 1.28 mg/dL — ABNORMAL HIGH (ref 0.50–1.10)
GFR, Est African American: 59 mL/min — ABNORMAL LOW (ref >=60)
GFR, Est Non African American: 51 mL/min — ABNORMAL LOW (ref >=60)
Glucose, Bld: 183 mg/dL — ABNORMAL HIGH (ref 65–99)
Potassium: 3.3 mmol/L — ABNORMAL LOW (ref 3.5–5.3)
Sodium: 140 mmol/L (ref 135–146)

## 2016-01-03 ENCOUNTER — Other Ambulatory Visit: Payer: Self-pay | Admitting: Nurse Practitioner

## 2016-01-03 ENCOUNTER — Ambulatory Visit: Payer: Medicare Other | Admitting: Nurse Practitioner

## 2016-01-03 DIAGNOSIS — I10 Essential (primary) hypertension: Secondary | ICD-10-CM

## 2016-01-03 MED ORDER — POTASSIUM CHLORIDE ER 10 MEQ PO TBCR: 10.0000 meq | EXTENDED_RELEASE_TABLET | Freq: Every day | ORAL | 1 refills | Status: DC

## 2016-01-04 ENCOUNTER — Other Ambulatory Visit: Payer: Self-pay | Admitting: Nurse Practitioner

## 2016-01-12 ENCOUNTER — Encounter: Payer: Self-pay | Admitting: Nurse Practitioner

## 2016-01-12 ENCOUNTER — Ambulatory Visit (INDEPENDENT_AMBULATORY_CARE_PROVIDER_SITE_OTHER): Payer: Medicare Other | Admitting: Nurse Practitioner

## 2016-01-12 VITALS — BP 140/78 | HR 110 | Temp 98.5°F | Resp 18 | Ht 64.0 in | Wt 176.0 lb

## 2016-01-12 DIAGNOSIS — I10 Essential (primary) hypertension: Secondary | ICD-10-CM

## 2016-01-12 DIAGNOSIS — R000 Tachycardia, unspecified: Secondary | ICD-10-CM | POA: Diagnosis not present

## 2016-01-12 LAB — BASIC METABOLIC PANEL WITH GFR
BUN: 14 mg/dL (ref 7–25)
CO2: 26 mmol/L (ref 20–31)
Calcium: 10 mg/dL (ref 8.6–10.2)
Chloride: 104 mmol/L (ref 98–110)
Creat: 1.14 mg/dL — ABNORMAL HIGH (ref 0.50–1.10)
GFR, Est African American: 68 mL/min (ref >=60)
GFR, Est Non African American: 59 mL/min — ABNORMAL LOW (ref >=60)
Glucose, Bld: 116 mg/dL — ABNORMAL HIGH (ref 65–99)
Potassium: 3.7 mmol/L (ref 3.5–5.3)
Sodium: 138 mmol/L (ref 135–146)

## 2016-01-12 LAB — TSH: TSH: 1.08 mIU/L

## 2016-01-12 MED ORDER — METOPROLOL TARTRATE 25 MG PO TABS: 25.0000 mg | ORAL_TABLET | Freq: Two times a day (BID) | ORAL | 1 refills | Status: DC

## 2016-01-12 NOTE — Progress Notes (Signed)
Careteam: Patient Care Team: Lauree Chandler, NP as PCP - General (Geriatric Medicine)  Advanced Directive information Does patient have an advance directive?: No  No Known Allergies  Chief Complaint  Patient presents with  . Medical Management of Chronic Issues    4 week follow up on blood pressure with labs before.      HPI: Patient is a 45 y.o. female seen in the office today to follow up blood pressure. Pt here with mother who provides most of hx.  Pt was started on HCTZ and instructed to cont lifestyle modifications.  Lab work done last week showing low potassium, kcl was added and she has been taking.  Has not noticed an increase in urination.       Review of Systems:  Review of Systems  Constitutional: Negative for activity change, appetite change, fatigue and unexpected weight change.  HENT: Negative for congestion and hearing loss.   Eyes: Negative.   Respiratory: Negative for cough and shortness of breath.   Cardiovascular: Negative for chest pain, palpitations and leg swelling.  Gastrointestinal: Negative for constipation and diarrhea.  Genitourinary: Positive for frequency (since on diuretic ). Negative for difficulty urinating and dysuria.  Musculoskeletal: Negative for arthralgias and myalgias.  Skin: Negative for color change and wound.  Neurological: Negative for dizziness and weakness.  Psychiatric/Behavioral: Negative for agitation, behavioral problems and confusion.    Past Medical History:  Diagnosis Date  . Hyperlipidemia   . Overactive bladder   . Recurrent UTI    Past Surgical History:  Procedure Laterality Date  . uretral stent     Social History:   reports that she has never smoked. She has never used smokeless tobacco. She reports that she does not drink alcohol or use drugs.  Family History  Problem Relation Age of Onset  . Hypertension Mother   . Arthritis Mother   . Cancer Father 60    stomach    Medications: Patient's  Medications  New Prescriptions   No medications on file  Previous Medications   CHOLECALCIFEROL (D3 SUPER STRENGTH) 2000 UNITS CAPS    Take 2,000 Units by mouth daily.   HYDROCHLOROTHIAZIDE (MICROZIDE) 12.5 MG CAPSULE    Take 1 capsule (12.5 mg total) by mouth daily.   LORATADINE (CLARITIN) 10 MG TABLET    TAKE 1 TABLET (10 MG TOTAL) BY MOUTH DAILY.   MEDROXYPROGESTERONE (DEPO-PROVERA) 150 MG/ML INJECTION    Inject 1 mL (150 mg total) into the muscle every 3 (three) months.   MULTIVITAMIN-IRON-MINERALS-FOLIC ACID (CENTRUM) CHEWABLE TABLET    Chew 2 tablets by mouth daily.   POTASSIUM CHLORIDE (K-DUR) 10 MEQ TABLET    Take 1 tablet (10 mEq total) by mouth daily.   PRAVASTATIN (PRAVACHOL) 40 MG TABLET    TAKE 1 TABLET (40 MG TOTAL) BY MOUTH DAILY.  Modified Medications   No medications on file  Discontinued Medications   No medications on file     Physical Exam:  Vitals:   01/12/16 1521  BP: 140/78  Pulse: (!) 110  Resp: 18  Temp: 98.5 F (36.9 C)  TempSrc: Oral  SpO2: 98%  Weight: 176 lb (79.8 kg)  Height: 5' 4" (1.626 m)   Body mass index is 30.21 kg/m.  Physical Exam  Constitutional: She is oriented to person, place, and time. She appears well-developed and well-nourished. No distress.  HENT:  Head: Normocephalic and atraumatic.  Mouth/Throat: Oropharynx is clear and moist. No oropharyngeal exudate.  Eyes: Conjunctivae are normal.  Pupils are equal, round, and reactive to light.  Neck: Normal range of motion. Neck supple.  Cardiovascular: Regular rhythm.  Tachycardia present.   Pulmonary/Chest: Effort normal and breath sounds normal.  Abdominal: Soft. Bowel sounds are normal.  Musculoskeletal: She exhibits no edema or tenderness.  Neurological: She is alert and oriented to person, place, and time.  Skin: Skin is warm and dry. She is not diaphoretic.  Psychiatric: She has a normal mood and affect.  Nursing note reviewed.   Labs reviewed: Basic Metabolic  Panel:  Recent Labs  07/04/15 1052 10/03/15 0852 01/02/16 0829  NA 144 142 140  K 3.9 3.6 3.3*  CL 105 108 104  CO2 _0 GLUCOSE 115* 117* 183*  BUN _1 CREATININE 1.05* 1.10 1.28*  CALCIUM 9.6 9.5 9.9   Liver Function Tests:  Recent Labs  03/30/15 0851 07/04/15 1052 10/03/15 0852  AST 11 28  --   ALT _2 ALKPHOS 79 80  --   BILITOT 1.7* 1.0  --   PROT 7.0 7.0  --   ALBUMIN 4.2 3.8  --    No results for input(s): LIPASE, AMYLASE in the last 8760 hours. No results for input(s): AMMONIA in the last 8760 hours. CBC:  Recent Labs  03/30/15 0851 07/04/15 1032  WBC 5.6 5.7  NEUTROABS 3.0 3.5  HCT 38.8 35.6  MCV 107* 107*  PLT 266 321   Lipid Panel:  Recent Labs  03/30/15 0851 07/04/15 1052 10/03/15 0854  CHOL 173 149 148  HDL 49 43 50  LDLCALC 113* 98 90  TRIG 54 39 38  CHOLHDL 3.5 3.5 3.0   TSH: No results for input(s): TSH in the last 8760 hours. A1C: Lab Results  Component Value Date   HGBA1C 5.5 07/04/2015     Assessment/Plan 1. Essential hypertension Remains elevated, recheck was 138/88 -cont on hctz with potassium and due to elevation in HR - BMP with eGFR - metoprolol tartrate (LOPRESSOR) 25 MG tablet; Take 1 tablet (25 mg total) by mouth 2 (two) times daily.  Dispense: 60 tablet; Refill: 1 - EKG 12-Lead  2. Tachycardia - metoprolol tartrate (LOPRESSOR) 25 MG tablet; Take 1 tablet (25 mg total) by mouth 2 (two) times daily.  Dispense: 60 tablet; Refill: 1 - EKG 12-Lead showing sinus tachycardia.  -will get TSH  Follow up in 4 weeks with blood pressure and hr reading Jevante Hollibaugh K. Harle Battiest  [facility] [phone] 8 am - 5 pm) [phone] (after hours)

## 2016-01-12 NOTE — Patient Instructions (Signed)
To start metoprolol 25 mg by mouth twice daily (with food) for blood pressure and heart rate   To take blood pressure and heart rate daily after medication has been given

## 2016-01-30 ENCOUNTER — Other Ambulatory Visit: Payer: Self-pay | Admitting: Nurse Practitioner

## 2016-01-30 DIAGNOSIS — I10 Essential (primary) hypertension: Secondary | ICD-10-CM

## 2016-02-09 ENCOUNTER — Ambulatory Visit (INDEPENDENT_AMBULATORY_CARE_PROVIDER_SITE_OTHER): Payer: Medicare Other | Admitting: Nurse Practitioner

## 2016-02-09 ENCOUNTER — Encounter: Payer: Self-pay | Admitting: Nurse Practitioner

## 2016-02-09 VITALS — BP 132/84 | HR 91 | Temp 98.1°F | Resp 19 | Ht 64.0 in | Wt 173.2 lb

## 2016-02-09 DIAGNOSIS — I10 Essential (primary) hypertension: Secondary | ICD-10-CM

## 2016-02-09 DIAGNOSIS — J00 Acute nasopharyngitis [common cold]: Secondary | ICD-10-CM | POA: Diagnosis not present

## 2016-02-09 NOTE — Progress Notes (Signed)
Careteam: Patient Care Team: Lauree Chandler, NP as PCP - General (Geriatric Medicine)  Advanced Directive information Does patient have an advance directive?: No  No Known Allergies  Chief Complaint  Patient presents with  . Medical Management of Chronic Issues    4 week follow up on blood pressure and heart rate     HPI: Patient is a 45 y.o. female seen in the office today for a re-check of her BP and HR since having Lopressor added at last visit on 10/19. Both BP and HR improved today. When mom checks BP at home readings of 130s/80-90s are obtained.  Diet changes not initiated at this time due to circumstances at home. Mother reports her other daughter recently moved back home with her three children.  Pt reports she has a runny nose, nonproductive cough, and slighly sore throat x1 week. Taking mucinex DM with good relief. Denies fever or chills.  Eating and drinking well.   Review of Systems:  Review of Systems  Constitutional: Positive for unexpected weight change. Negative for appetite change, chills and fever.       Reports she has lost 3 lbs, still eating and drinking well  HENT: Positive for rhinorrhea and sore throat. Negative for congestion, postnasal drip, sinus pain, sinus pressure and sneezing.   Respiratory: Positive for cough. Negative for shortness of breath and wheezing.   Cardiovascular: Negative for chest pain, palpitations and leg swelling.  Genitourinary: Negative for difficulty urinating and frequency.    Past Medical History:  Diagnosis Date  . Hyperlipidemia   . Overactive bladder   . Recurrent UTI    Past Surgical History:  Procedure Laterality Date  . uretral stent     Social History:   reports that she has never smoked. She has never used smokeless tobacco. She reports that she does not drink alcohol or use drugs.  Family History  Problem Relation Age of Onset  . Hypertension Mother   . Arthritis Mother   . Cancer Father 16   stomach    Medications: Patient's Medications  New Prescriptions   No medications on file  Previous Medications   CHOLECALCIFEROL (D3 SUPER STRENGTH) 2000 UNITS CAPS    Take 2,000 Units by mouth daily.   HYDROCHLOROTHIAZIDE (MICROZIDE) 12.5 MG CAPSULE    TAKE 1 CAPSULE (12.5 MG TOTAL) BY MOUTH DAILY.   LORATADINE (CLARITIN) 10 MG TABLET    TAKE 1 TABLET (10 MG TOTAL) BY MOUTH DAILY.   MEDROXYPROGESTERONE (DEPO-PROVERA) 150 MG/ML INJECTION    Inject 1 mL (150 mg total) into the muscle every 3 (three) months.   METOPROLOL TARTRATE (LOPRESSOR) 25 MG TABLET    Take 1 tablet (25 mg total) by mouth 2 (two) times daily.   MULTIVITAMIN-IRON-MINERALS-FOLIC ACID (CENTRUM) CHEWABLE TABLET    Chew 2 tablets by mouth daily.   POTASSIUM CHLORIDE (K-DUR) 10 MEQ TABLET    Take 1 tablet (10 mEq total) by mouth daily.   PRAVASTATIN (PRAVACHOL) 40 MG TABLET    TAKE 1 TABLET (40 MG TOTAL) BY MOUTH DAILY.  Modified Medications   No medications on file  Discontinued Medications   No medications on file     Physical Exam:  Vitals:   02/09/16 0836  BP: 132/84  Pulse: 91  Resp: 19  Temp: 98.1 F (36.7 C)  TempSrc: Oral  SpO2: 98%  Weight: 173 lb 3.2 oz (78.6 kg)  Height: _0  (1.626 m)   Body mass index is 29.73 kg/m.  Physical  Exam  Constitutional: She appears well-developed and well-nourished.  HENT:  Head: Normocephalic and atraumatic.  Right Ear: External ear normal.  Left Ear: External ear normal.  Nose: Nose normal.  Mouth/Throat: Oropharynx is clear and moist.  Eyes: Conjunctivae are normal. Pupils are equal, round, and reactive to light.  Neck: Normal range of motion. Neck supple. No thyromegaly present.  Cardiovascular: Normal rate, regular rhythm and normal heart sounds.   Pulmonary/Chest: Effort normal and breath sounds normal.  Abdominal: Soft. Bowel sounds are normal.  Musculoskeletal: She exhibits no edema.  Lymphadenopathy:    She has no cervical adenopathy.    Neurological: She is alert.  Skin: Skin is warm and dry.  Psychiatric: She has a normal mood and affect.    Labs reviewed: Basic Metabolic Panel:  Recent Labs  10/03/15 0852 01/02/16 0829 01/12/16 1605 01/12/16 1612  NA 142 140 138  --   K 3.6 3.3* 3.7  --   CL 108 104 104  --   CO2 _0 --   GLUCOSE 117* 183* 116*  --   BUN _1 --   CREATININE 1.10 1.28* 1.14*  --   CALCIUM 9.5 9.9 10.0  --   TSH  --   --   --  1.08   Liver Function Tests:  Recent Labs  03/30/15 0851 07/04/15 1052 10/03/15 0852  AST 11 28  --   ALT _2 ALKPHOS 79 80  --   BILITOT 1.7* 1.0  --   PROT 7.0 7.0  --   ALBUMIN 4.2 3.8  --    No results for input(s): LIPASE, AMYLASE in the last 8760 hours. No results for input(s): AMMONIA in the last 8760 hours. CBC:  Recent Labs  03/30/15 0851 07/04/15 1032  WBC 5.6 5.7  NEUTROABS 3.0 3.5  HCT 38.8 35.6  MCV 107* 107*  PLT 266 321   Lipid Panel:  Recent Labs  03/30/15 0851 07/04/15 1052 10/03/15 0854  CHOL 173 149 148  HDL 49 43 50  LDLCALC 113* 98 90  TRIG 54 39 38  CHOLHDL 3.5 3.5 3.0   TSH:  Recent Labs  01/12/16 1612  TSH 1.08   A1C: Lab Results  Component Value Date   HGBA1C 5.5 07/04/2015     Assessment/Plan 1. Essential hypertension -blood pressure improved, will cont current medication and dietary modifications encouraged -  DASH diet provided - CMP with eGFR; Future - CBC with Differential/Platelets; Future  2. Acute nasopharyngitis (common cold) -  Symptom management at this time.  - Continue OTC Mucinex DM BID with a full glass of water. -  Increase hydration  -notify if symptoms fail to improve or worsen.   Follow up in 2 months with lab work prior to visit  Christine Terrell K. Harle Battiest  [facility] [phone] 8 am - 5 pm) [phone] (after hours)

## 2016-02-09 NOTE — Patient Instructions (Addendum)
Keep taking Mucinex DM twice a day with a full glass of water.  Drinks lots of water.  Work on making dietary adjustments to assist in lowering blood pressure. Cont blood pressure medications     Upper Respiratory Infection, Adult Most upper respiratory infections (URIs) are a viral infection of the air passages leading to the lungs. A URI affects the nose, throat, and upper air passages. The most common type of URI is nasopharyngitis and is typically referred to as "the common cold." URIs run their course and usually go away on their own. Most of the time, a URI does not require medical attention, but sometimes a bacterial infection in the upper airways can follow a viral infection. This is called a secondary infection. Sinus and middle ear infections are common types of secondary upper respiratory infections. Bacterial pneumonia can also complicate a URI. A URI can worsen asthma and chronic obstructive pulmonary disease (COPD). Sometimes, these complications can require emergency medical care and may be life threatening. What are the causes? Almost all URIs are caused by viruses. A virus is a type of germ and can spread from one person to another. What increases the risk? You may be at risk for a URI if:  You smoke.  You have chronic heart or lung disease.  You have a weakened defense (immune) system.  You are very young or very old.  You have nasal allergies or asthma.  You work in crowded or poorly ventilated areas.  You work in health care facilities or schools. What are the signs or symptoms? Symptoms typically develop 2-3 days after you come in contact with a cold virus. Most viral URIs last 7-10 days. However, viral URIs from the influenza virus (flu virus) can last 14-18 days and are typically more severe. Symptoms may include:  Runny or stuffy (congested) nose.  Sneezing.  Cough.  Sore throat.  Headache.  Fatigue.  Fever.  Loss of appetite.  Pain in your  forehead, behind your eyes, and over your cheekbones (sinus pain).  Muscle aches. How is this diagnosed? Your health care provider may diagnose a URI by:  Physical exam.  Tests to check that your symptoms are not due to another condition such as:  Strep throat.  Sinusitis.  Pneumonia.  Asthma. How is this treated? A URI goes away on its own with time. It cannot be cured with medicines, but medicines may be prescribed or recommended to relieve symptoms. Medicines may help:  Reduce your fever.  Reduce your cough.  Relieve nasal congestion. Follow these instructions at home:  Take medicines only as directed by your health care provider.  Gargle warm saltwater or take cough drops to comfort your throat as directed by your health care provider.  Use a warm mist humidifier or inhale steam from a shower to increase air moisture. This may make it easier to breathe.  Drink enough fluid to keep your urine clear or pale yellow.  Eat soups and other clear broths and maintain good nutrition.  Rest as needed.  Return to work when your temperature has returned to normal or as your health care provider advises. You may need to stay home longer to avoid infecting others. You can also use a face mask and careful hand washing to prevent spread of the virus.  Increase the usage of your inhaler if you have asthma.  Do not use any tobacco products, including cigarettes, chewing tobacco, or electronic cigarettes. If you need help quitting, ask your health care  provider. How is this prevented? The best way to protect yourself from getting a cold is to practice good hygiene.  Avoid oral or hand contact with people with cold symptoms.  Wash your hands often if contact occurs. There is no clear evidence that vitamin C, vitamin E, echinacea, or exercise reduces the chance of developing a cold. However, it is always recommended to get plenty of rest, exercise, and practice good  nutrition. Contact a health care provider if:  You are getting worse rather than better.  Your symptoms are not controlled by medicine.  You have chills.  You have worsening shortness of breath.  You have brown or red mucus.  You have yellow or brown nasal discharge.  You have pain in your face, especially when you bend forward.  You have a fever.  You have swollen neck glands.  You have pain while swallowing.  You have white areas in the back of your throat. Get help right away if:  You have severe or persistent:  Headache.  Ear pain.  Sinus pain.  Chest pain.  You have chronic lung disease and any of the following:  Wheezing.  Prolonged cough.  Coughing up blood.  A change in your usual mucus.  You have a stiff neck.  You have changes in your:  Vision.  Hearing.  Thinking.  Mood. This information is not intended to replace advice given to you by your health care provider. Make sure you discuss any questions you have with your health care provider. Document Released: 09/05/2000 Document Revised: 11/13/2015 Document Reviewed: 06/17/2013 Elsevier Interactive Patient Education  2017 [place] DASH stands for "Dietary Approaches to Stop Hypertension." The DASH eating plan is a healthy eating plan that has been shown to reduce high blood pressure (hypertension). Additional health benefits may include reducing the risk of type 2 diabetes mellitus, heart disease, and stroke. The DASH eating plan may also help with weight loss. What do I need to know about the DASH eating plan? For the DASH eating plan, you will follow these general guidelines:  Choose foods with less than 150 milligrams of sodium per serving (as listed on the food label).  Use salt-free seasonings or herbs instead of table salt or sea salt.  Check with your health care provider or pharmacist before using salt substitutes.  Eat lower-sodium products. These are  often labeled as "low-sodium" or "no salt added."  Eat fresh foods. Avoid eating a lot of canned foods.  Eat more vegetables, fruits, and low-fat dairy products.  Choose whole grains. Look for the word "whole" as the first word in the ingredient list.  Choose fish and skinless chicken or [place] more often than red meat. Limit fish, poultry, and meat to 6 oz (170 g) each day.  Limit sweets, desserts, sugars, and sugary drinks.  Choose heart-healthy fats.  Eat more home-cooked food and less restaurant, buffet, and fast food.  Limit fried foods.  Do not fry foods. Cook foods using methods such as baking, boiling, grilling, and broiling instead.  When eating at a restaurant, ask that your food be prepared with less salt, or no salt if possible. What foods can I eat? Seek help from a dietitian for individual calorie needs. Grains  Whole grain or whole wheat bread. Brown rice. Whole grain or whole wheat pasta. Quinoa, bulgur, and whole grain cereals. Low-sodium cereals. Corn or whole wheat flour tortillas. Whole grain cornbread. Whole grain crackers. Low-sodium crackers. Vegetables  Fresh or  frozen vegetables (raw, steamed, roasted, or grilled). Low-sodium or reduced-sodium tomato and vegetable juices. Low-sodium or reduced-sodium tomato sauce and paste. Low-sodium or reduced-sodium canned vegetables. Fruits  All fresh, canned (in natural juice), or frozen fruits. Meat and Other Protein Products  Ground beef (85% or leaner), grass-fed beef, or beef trimmed of fat. Skinless chicken or [place]. Ground chicken or [place]. Pork trimmed of fat. All fish and seafood. Eggs. Dried beans, peas, or lentils. Unsalted nuts and seeds. Unsalted canned beans. Dairy  Low-fat dairy products, such as skim or 1% milk, 2% or reduced-fat cheeses, low-fat ricotta or cottage cheese, or plain low-fat yogurt. Low-sodium or reduced-sodium cheeses. Fats and Oils  Tub margarines without trans fats. Light or  reduced-fat mayonnaise and salad dressings (reduced sodium). Avocado. Safflower, olive, or canola oils. Natural peanut or almond butter. Other  Unsalted popcorn and pretzels. The items listed above may not be a complete list of recommended foods or beverages. Contact your dietitian for more options.  What foods are not recommended? Grains  White bread. White pasta. White rice. Refined cornbread. Bagels and croissants. Crackers that contain trans fat. Vegetables  Creamed or fried vegetables. Vegetables in a cheese sauce. Regular canned vegetables. Regular canned tomato sauce and paste. Regular tomato and vegetable juices. Fruits  Canned fruit in light or heavy syrup. Fruit juice. Meat and Other Protein Products  Fatty cuts of meat. Ribs, chicken wings, bacon, sausage, bologna, salami, chitterlings, fatback, hot dogs, bratwurst, and packaged luncheon meats. Salted nuts and seeds. Canned beans with salt. Dairy  Whole or 2% milk, cream, half-and-half, and cream cheese. Whole-fat or sweetened yogurt. Full-fat cheeses or blue cheese. Nondairy creamers and whipped toppings. Processed cheese, cheese spreads, or cheese curds. Condiments  Onion and garlic salt, seasoned salt, table salt, and sea salt. Canned and packaged gravies. Worcestershire sauce. Tartar sauce. Barbecue sauce. Teriyaki sauce. Soy sauce, including reduced sodium. Steak sauce. Fish sauce. Oyster sauce. Cocktail sauce. Horseradish. Ketchup and mustard. Meat flavorings and tenderizers. Bouillon cubes. Hot sauce. Tabasco sauce. Marinades. Taco seasonings. Relishes. Fats and Oils  Butter, stick margarine, lard, shortening, ghee, and bacon fat. Coconut, palm kernel, or palm oils. Regular salad dressings. Other  Pickles and olives. Salted popcorn and pretzels. The items listed above may not be a complete list of foods and beverages to avoid. Contact your dietitian for more information.  Where can I find more information? National Heart,  Lung, and Blood Institute: travelstabloid.com This information is not intended to replace advice given to you by your health care provider. Make sure you discuss any questions you have with your health care provider. Document Released: 03/01/2011 Document Revised: 08/18/2015 Document Reviewed: 01/14/2013 Elsevier Interactive Patient Education  2017 [employer].

## 2016-02-29 ENCOUNTER — Other Ambulatory Visit: Payer: Self-pay | Admitting: Nurse Practitioner

## 2016-02-29 DIAGNOSIS — N3281 Overactive bladder: Secondary | ICD-10-CM

## 2016-02-29 DIAGNOSIS — Z3042 Encounter for surveillance of injectable contraceptive: Secondary | ICD-10-CM | POA: Diagnosis not present

## 2016-02-29 NOTE — Telephone Encounter (Signed)
Left message on voicemail for patient to return call when available   

## 2016-03-05 ENCOUNTER — Other Ambulatory Visit: Payer: Self-pay | Admitting: Nurse Practitioner

## 2016-03-05 DIAGNOSIS — I10 Essential (primary) hypertension: Secondary | ICD-10-CM

## 2016-03-28 ENCOUNTER — Other Ambulatory Visit: Payer: Self-pay | Admitting: Nurse Practitioner

## 2016-03-28 DIAGNOSIS — I10 Essential (primary) hypertension: Secondary | ICD-10-CM

## 2016-03-28 DIAGNOSIS — R000 Tachycardia, unspecified: Secondary | ICD-10-CM

## 2016-04-05 ENCOUNTER — Other Ambulatory Visit: Payer: Medicare Other

## 2016-04-12 ENCOUNTER — Ambulatory Visit: Payer: Medicare Other | Admitting: Nurse Practitioner

## 2016-04-28 ENCOUNTER — Other Ambulatory Visit: Payer: Self-pay | Admitting: Internal Medicine

## 2016-04-28 DIAGNOSIS — I10 Essential (primary) hypertension: Secondary | ICD-10-CM

## 2016-04-30 ENCOUNTER — Other Ambulatory Visit: Payer: Self-pay

## 2016-04-30 ENCOUNTER — Other Ambulatory Visit: Payer: Medicare Other

## 2016-04-30 DIAGNOSIS — I10 Essential (primary) hypertension: Secondary | ICD-10-CM

## 2016-04-30 LAB — COMPLETE METABOLIC PANEL WITH GFR
ALT: 13 U/L (ref 6–29)
AST: 13 U/L (ref 10–30)
Albumin: 4.1 g/dL (ref 3.6–5.1)
Alkaline Phosphatase: 60 U/L (ref 33–115)
BUN: 21 mg/dL (ref 7–25)
CO2: 24 mmol/L (ref 20–31)
Calcium: 9.8 mg/dL (ref 8.6–10.2)
Chloride: 103 mmol/L (ref 98–110)
Creat: 1.34 mg/dL — ABNORMAL HIGH (ref 0.50–1.10)
GFR, Est African American: 56 mL/min — ABNORMAL LOW (ref >=60)
GFR, Est Non African American: 49 mL/min — ABNORMAL LOW (ref >=60)
Glucose, Bld: 191 mg/dL — ABNORMAL HIGH (ref 65–99)
Potassium: 3.7 mmol/L (ref 3.5–5.3)
Sodium: 138 mmol/L (ref 135–146)
Total Bilirubin: 1.6 mg/dL — ABNORMAL HIGH (ref 0.2–1.2)
Total Protein: 7 g/dL (ref 6.1–8.1)

## 2016-04-30 LAB — CBC WITH DIFFERENTIAL/PLATELET
Basophils Absolute: 0 cells/uL (ref 0–200)
Basophils Relative: 0 %
Eosinophils Absolute: 0 cells/uL — ABNORMAL LOW (ref 15–500)
Eosinophils Relative: 0 %
HCT: 38.3 % (ref 35.0–45.0)
Hemoglobin: 12.7 g/dL (ref 11.7–15.5)
Lymphocytes Relative: 34 %
Lymphs Abs: 1802 cells/uL (ref 850–3900)
MCH: 35.6 pg — ABNORMAL HIGH (ref 27.0–33.0)
MCHC: 33.2 g/dL (ref 32.0–36.0)
MCV: 107.3 fL — ABNORMAL HIGH (ref 80.0–100.0)
MPV: 10.9 fL (ref 7.5–12.5)
Monocytes Absolute: 371 cells/uL (ref 200–950)
Monocytes Relative: 7 %
Neutro Abs: 3127 cells/uL (ref 1500–7800)
Neutrophils Relative %: 59 %
Platelets: 274 10*3/uL (ref 140–400)
RBC: 3.57 MIL/uL — ABNORMAL LOW (ref 3.80–5.10)
RDW: 12.7 % (ref 11.0–15.0)
WBC: 5.3 10*3/uL (ref 3.8–10.8)

## 2016-05-01 ENCOUNTER — Ambulatory Visit (INDEPENDENT_AMBULATORY_CARE_PROVIDER_SITE_OTHER): Payer: Medicare Other | Admitting: Nurse Practitioner

## 2016-05-01 VITALS — BP 136/84 | HR 88 | Temp 97.7°F | Resp 18 | Ht 64.0 in | Wt 173.6 lb

## 2016-05-01 DIAGNOSIS — I10 Essential (primary) hypertension: Secondary | ICD-10-CM

## 2016-05-01 DIAGNOSIS — R000 Tachycardia, unspecified: Secondary | ICD-10-CM

## 2016-05-01 MED ORDER — METOPROLOL TARTRATE 50 MG PO TABS: 50.0000 mg | ORAL_TABLET | Freq: Two times a day (BID) | ORAL | 2 refills | Status: DC

## 2016-05-01 NOTE — Patient Instructions (Addendum)
STOP HCTZ and potassium supplement Increase metoprolol to 50 mg twice daily  Cont to take blood pressure (twice weekly) and record Follow up in 1 month with BP and HR

## 2016-05-01 NOTE — Progress Notes (Signed)
  Careteam: Patient Care Team: Jessica K Eubanks, NP as PCP - General (Geriatric Medicine)  Advanced Directive information Does Patient Have a Medical Advance Directive?: No  No Known Allergies  Chief Complaint  Patient presents with  . Medical Management of Chronic Issues    two month follow-up      HPI: Patient is a 45 y.o. female seen in the office today for f/u for BP and HR. Pt was started on metoprolol 03/2016 due to uncontrolled HTN and tachycardia. Pt previously on  and HCTZ with K+ supplementation. Pt has no complaints today. Reports taking BP reading with home machine once weekly with reading ranging from 130's/80's.   Labs drawn prior to appointment showed increases in creatinine and GFR.  Pt is eating and drinking, and urinating  Adequately. Denies fevers, chills, mylagias, SOB, Cp, or palpitations.  Review of Systems:  Review of Systems  Constitutional: Negative for activity change, appetite change, chills, fatigue, fever and unexpected weight change.  HENT: Negative for congestion, postnasal drip, rhinorrhea, sinus pressure, sneezing and sore throat.   Respiratory: Negative for cough, shortness of breath and wheezing.   Cardiovascular: Negative for chest pain, palpitations and leg swelling.  Genitourinary: Negative.   Neurological: Negative.   Psychiatric/Behavioral:       Mild cognitive dysfunction    Past Medical History:  Diagnosis Date  . Hyperlipidemia   . Overactive bladder   . Recurrent UTI    Past Surgical History:  Procedure Laterality Date  . uretral stent     Social History:   reports that she has never smoked. She has never used smokeless tobacco. She reports that she does not drink alcohol or use drugs.  Family History  Problem Relation Age of Onset  . Hypertension Mother   . Arthritis Mother   . Cancer Father 53    stomach    Medications: Patient's Medications  New Prescriptions   No medications on file  Previous Medications   CHOLECALCIFEROL (D3 SUPER STRENGTH) 2000 UNITS CAPS    Take 2,000 Units by mouth daily.   HYDROCHLOROTHIAZIDE (MICROZIDE) 12.5 MG CAPSULE    TAKE 1 CAPSULE (12.5 MG TOTAL) BY MOUTH DAILY.   KLOR-CON 10 10 MEQ TABLET    TAKE 1 TABLET EVERY DAY   LORATADINE (CLARITIN) 10 MG TABLET    TAKE 1 TABLET (10 MG TOTAL) BY MOUTH DAILY.   MEDROXYPROGESTERONE (DEPO-PROVERA) 150 MG/ML INJECTION    Inject 1 mL (150 mg total) into the muscle every 3 (three) months.   METOPROLOL TARTRATE (LOPRESSOR) 25 MG TABLET    TAKE 1 TABLET BY MOUTH 2 TIMES DAILY.   MULTIVITAMIN-IRON-MINERALS-FOLIC ACID (CENTRUM) CHEWABLE TABLET    Chew 2 tablets by mouth daily.   PRAVASTATIN (PRAVACHOL) 40 MG TABLET    TAKE 1 TABLET (40 MG TOTAL) BY MOUTH DAILY.  Modified Medications   No medications on file  Discontinued Medications   No medications on file     Physical Exam:  Vitals:   05/01/16 1533  BP: 136/84  Pulse: 88  Resp: 18  Temp: 97.7 F (36.5 C)  TempSrc: Oral  SpO2: 96%  Weight: 173 lb 9.6 oz (78.7 kg)  Height: 5' 4" (1.626 m)   Body mass index is 29.8 kg/m.  Physical Exam  Constitutional: She is oriented to person, place, and time. She appears well-developed and well-nourished. No distress.  HENT:  Head: Normocephalic and atraumatic.  Right Ear: External ear normal.  Left Ear: External ear normal.  Eyes: Conjunctivae   and EOM are normal. Pupils are equal, round, and reactive to light.  Cardiovascular: Normal rate and regular rhythm.   No murmur heard. Pulmonary/Chest: Effort normal and breath sounds normal. No respiratory distress. She has no wheezes. She exhibits no tenderness.  Neurological: She is alert and oriented to person, place, and time.  Skin: Skin is warm and dry.  Psychiatric: She has a normal mood and affect.    Labs reviewed: Basic Metabolic Panel:  Recent Labs  01/02/16 0829 01/12/16 1605 01/12/16 1612 04/30/16 0818  NA 140 138  --  138  K 3.3* 3.7  --  3.7  CL 104 104  --   103  CO2 27 26  --  24  GLUCOSE 183* 116*  --  191*  BUN 20 14  --  21  CREATININE 1.28* 1.14*  --  1.34*  CALCIUM 9.9 10.0  --  9.8  TSH  --   --  1.08  --    Liver Function Tests:  Recent Labs  07/04/15 1052 10/03/15 0852 04/30/16 0818  AST 28  --  13  ALT _0 ALKPHOS 80  --  60  BILITOT 1.0  --  1.6*  PROT 7.0  --  7.0  ALBUMIN 3.8  --  4.1   No results for input(s): LIPASE, AMYLASE in the last 8760 hours. No results for input(s): AMMONIA in the last 8760 hours. CBC:  Recent Labs  07/04/15 1032 04/30/16 0820  WBC 5.7 5.3  NEUTROABS 3.5 3,127  HGB  --  12.7  HCT 35.6 38.3  MCV 107* 107.3*  PLT 321 274   Lipid Panel:  Recent Labs  07/04/15 1052 10/03/15 0854  CHOL 149 148  HDL 43 50  LDLCALC 98 90  TRIG 39 38  CHOLHDL 3.5 3.0   TSH:  Recent Labs  01/12/16 1612  TSH 1.08   A1C: Lab Results  Component Value Date   HGBA1C 5.5 07/04/2015    Assessment/Plan 1. Essential hypertension - BMP with eGFR; Future -Will discontinue HCTZ and K+ supplement today due to increased kidney function. Cr=1.34, GFR=56 -Will increase Metoprolol to 45m  -If unable to regulate BP to acceptable levels, will add lisinopril  -Will follow up in four weeks for labs and BP monitoring, sooner if needed   To call if HR < 60  2. Tachycardia Improved, will cont lopressor.  - metoprolol tartrate (LOPRESSOR) 50 MG tablet; Take 1 tablet (50 mg total) by mouth 2 (two) times daily.  Dispense: 60 tablet; Refill: 2  Jessica K. EHarle Battiest [facility] [phone] am - 5 pm) [phone](after hours)

## 2016-05-23 DIAGNOSIS — Z3042 Encounter for surveillance of injectable contraceptive: Secondary | ICD-10-CM | POA: Diagnosis not present

## 2016-05-25 ENCOUNTER — Other Ambulatory Visit: Payer: Medicare Other

## 2016-05-25 DIAGNOSIS — I10 Essential (primary) hypertension: Secondary | ICD-10-CM

## 2016-05-25 LAB — BASIC METABOLIC PANEL WITH GFR
BUN: 11 mg/dL (ref 7–25)
CO2: 23 mmol/L (ref 20–31)
Calcium: 9.4 mg/dL (ref 8.6–10.2)
Chloride: 108 mmol/L (ref 98–110)
Creat: 1.05 mg/dL (ref 0.50–1.10)
GFR, Est African American: 75 mL/min (ref >=60)
GFR, Est Non African American: 65 mL/min (ref >=60)
Glucose, Bld: 104 mg/dL — ABNORMAL HIGH (ref 65–99)
Potassium: 4 mmol/L (ref 3.5–5.3)
Sodium: 140 mmol/L (ref 135–146)

## 2016-05-26 ENCOUNTER — Other Ambulatory Visit: Payer: Self-pay | Admitting: Nurse Practitioner

## 2016-05-26 DIAGNOSIS — I10 Essential (primary) hypertension: Secondary | ICD-10-CM

## 2016-05-26 DIAGNOSIS — R000 Tachycardia, unspecified: Secondary | ICD-10-CM

## 2016-05-29 ENCOUNTER — Encounter: Payer: Self-pay | Admitting: Nurse Practitioner

## 2016-05-29 ENCOUNTER — Ambulatory Visit (INDEPENDENT_AMBULATORY_CARE_PROVIDER_SITE_OTHER): Payer: Medicare HMO | Admitting: Nurse Practitioner

## 2016-05-29 VITALS — BP 132/84 | HR 67 | Temp 98.6°F | Resp 18 | Ht 64.0 in | Wt 180.4 lb

## 2016-05-29 DIAGNOSIS — R000 Tachycardia, unspecified: Secondary | ICD-10-CM

## 2016-05-29 DIAGNOSIS — I10 Essential (primary) hypertension: Secondary | ICD-10-CM | POA: Diagnosis not present

## 2016-05-29 NOTE — Progress Notes (Signed)
Careteam: Patient Care Team: Lauree Chandler, NP as PCP - General (Geriatric Medicine)  Advanced Directive information Does Patient Have a Medical Advance Directive?: No  No Known Allergies  Chief Complaint  Patient presents with  . Follow-up    Pt is being seen for 4 week follow up on BP.      HPI: Patient is a 46 y.o. female seen in the office today for blood pressure and lab follow up.  Last visit stopped hctz and potassium due to elevated BUN/CR and increased metoprolol to 50 mg BID.  Unable to check blood pressure and HR at home.  Pt reports she feels well. No headache, low energy, or dizziness. Mother reports she is more sluggish with increase in lopressor.  Hard to maintain proper diet due to living with extended family.   Review of Systems:  Review of Systems  Constitutional: Negative for activity change, appetite change, chills, fatigue, fever and unexpected weight change.  HENT: Negative for congestion, postnasal drip, rhinorrhea, sinus pressure, sneezing and sore throat.   Respiratory: Negative for cough, shortness of breath and wheezing.   Cardiovascular: Negative for chest pain, palpitations and leg swelling.  Genitourinary: Negative.   Neurological: Negative.   Psychiatric/Behavioral:       Mild cognitive dysfunction    Past Medical History:  Diagnosis Date  . Hyperlipidemia   . Overactive bladder   . Recurrent UTI    Past Surgical History:  Procedure Laterality Date  . uretral stent     Social History:   reports that she has never smoked. She has never used smokeless tobacco. She reports that she does not drink alcohol or use drugs.  Family History  Problem Relation Age of Onset  . Hypertension Mother   . Arthritis Mother   . Cancer Father 14    stomach    Medications: Patient's Medications  New Prescriptions   No medications on file  Previous Medications   CHOLECALCIFEROL (D3 SUPER STRENGTH) 2000 UNITS CAPS    Take 2,000 Units by  mouth daily.   LORATADINE (CLARITIN) 10 MG TABLET    TAKE 1 TABLET (10 MG TOTAL) BY MOUTH DAILY.   MEDROXYPROGESTERONE (DEPO-PROVERA) 150 MG/ML INJECTION    Inject 1 mL (150 mg total) into the muscle every 3 (three) months.   METOPROLOL (LOPRESSOR) 50 MG TABLET    Take 1 tablet by mouth 2 (two) times daily.   MULTIVITAMIN-IRON-MINERALS-FOLIC ACID (CENTRUM) CHEWABLE TABLET    Chew 2 tablets by mouth daily.   PRAVASTATIN (PRAVACHOL) 40 MG TABLET    TAKE 1 TABLET (40 MG TOTAL) BY MOUTH DAILY.  Modified Medications   No medications on file  Discontinued Medications   METOPROLOL TARTRATE (LOPRESSOR) 25 MG TABLET    TAKE 1 TABLET BY MOUTH 2 TIMES DAILY.     Physical Exam:  Vitals:   05/29/16 1520  BP: 132/84  Pulse: 67  Resp: 18  Temp: 98.6 F (37 C)  TempSrc: Oral  SpO2: 98%  Weight: 180 lb 6.4 oz (81.8 kg)  Height: 5\' 4"  (1.626 m)   Body mass index is 30.97 kg/m.  Physical Exam  Constitutional: She is oriented to person, place, and time. She appears well-developed and well-nourished. No distress.  HENT:  Head: Normocephalic and atraumatic.  Right Ear: External ear normal.  Left Ear: External ear normal.  Eyes: Conjunctivae and EOM are normal. Pupils are equal, round, and reactive to light.  Neck: Normal range of motion. Neck supple.  Cardiovascular: Normal  rate and regular rhythm.   No murmur heard. Pulmonary/Chest: Effort normal and breath sounds normal. No respiratory distress.  Abdominal: Soft. Bowel sounds are normal.  Musculoskeletal: She exhibits no edema or tenderness.  Neurological: She is alert and oriented to person, place, and time.  Skin: Skin is warm and dry.  Psychiatric: She has a normal mood and affect.    Labs reviewed: Basic Metabolic Panel:  Recent Labs  01/12/16 1605 01/12/16 1612 04/30/16 0818 05/25/16 0856  NA 138  --  138 140  K 3.7  --  3.7 4.0  CL 104  --  103 108  CO2 26  --  24 23  GLUCOSE 116*  --  191* 104*  BUN 14  --  21 11    CREATININE 1.14*  --  1.34* 1.05  CALCIUM 10.0  --  9.8 9.4  TSH  --  1.08  --   --    Liver Function Tests:  Recent Labs  07/04/15 1052 10/03/15 0852 04/30/16 0818  AST 28  --  13  ALT 20 14 13   ALKPHOS 80  --  60  BILITOT 1.0  --  1.6*  PROT 7.0  --  7.0  ALBUMIN 3.8  --  4.1   No results for input(s): LIPASE, AMYLASE in the last 8760 hours. No results for input(s): AMMONIA in the last 8760 hours. CBC:  Recent Labs  07/04/15 1032 04/30/16 0820  WBC 5.7 5.3  NEUTROABS 3.5 3,127  HGB  --  12.7  HCT 35.6 38.3  MCV 107* 107.3*  PLT 321 274   Lipid Panel:  Recent Labs  07/04/15 1052 10/03/15 0854  CHOL 149 148  HDL 43 50  LDLCALC 98 90  TRIG 39 38  CHOLHDL 3.5 3.0   TSH:  Recent Labs  01/12/16 1612  TSH 1.08   A1C: Lab Results  Component Value Date   HGBA1C 5.5 07/04/2015     Assessment/Plan 1. Essential hypertension Stable with changes to medication, labs have improved now that she is off HCTZ.  -will monitor activity level, if energy level does not improve to call office back for follow up  2. Tachycardia Stable, to cont metoprolol 50 mg BID.   Follow up in 3 months, sooner if needed   Niala Stcharles K. Harle Battiest  [facility] [phone] am - 5 pm) [phone] (after hours)

## 2016-05-29 NOTE — Patient Instructions (Signed)
DASH Eating Plan DASH stands for "Dietary Approaches to Stop Hypertension." The DASH eating plan is a healthy eating plan that has been shown to reduce high blood pressure (hypertension). It may also reduce your risk for type 2 diabetes, heart disease, and stroke. The DASH eating plan may also help with weight loss. What are tips for following this plan? General guidelines  Avoid eating more than 2,300 mg (milligrams) of salt (sodium) a day. If you have hypertension, you may need to reduce your sodium intake to 1,500 mg a day.  Limit alcohol intake to no more than 1 drink a day for nonpregnant women and 2 drinks a day for men. One drink equals 12 oz of beer, 5 oz of wine, or 1 oz of hard liquor.  Work with your health care provider to maintain a healthy body weight or to lose weight. Ask what an ideal weight is for you.  Get at least 30 minutes of exercise that causes your heart to beat faster (aerobic exercise) most days of the week. Activities may include walking, swimming, or biking.  Work with your health care provider or diet and nutrition specialist (dietitian) to adjust your eating plan to your individual calorie needs. Reading food labels  Check food labels for the amount of sodium per serving. Choose foods with less than 5 percent of the Daily Value of sodium. Generally, foods with less than 300 mg of sodium per serving fit into this eating plan.  To find whole grains, look for the word "whole" as the first word in the ingredient list. Shopping  Buy products labeled as "low-sodium" or "no salt added."  Buy fresh foods. Avoid canned foods and premade or frozen meals. Cooking  Avoid adding salt when cooking. Use salt-free seasonings or herbs instead of table salt or sea salt. Check with your health care provider or pharmacist before using salt substitutes.  Do not fry foods. Cook foods using healthy methods such as baking, boiling, grilling, and broiling instead.  Cook with  heart-healthy oils, such as olive, canola, soybean, or sunflower oil. Meal planning   Eat a balanced diet that includes: ? 5 or more servings of fruits and vegetables each day. At each meal, try to fill half of your plate with fruits and vegetables. ? Up to 6-8 servings of whole grains each day. ? Less than 6 oz of lean meat, poultry, or fish each day. A 3-oz serving of meat is about the same size as a deck of cards. One egg equals 1 oz. ? 2 servings of low-fat dairy each day. ? A serving of nuts, seeds, or beans 5 times each week. ? Heart-healthy fats. Healthy fats called Omega-3 fatty acids are found in foods such as flaxseeds and coldwater fish, like sardines, salmon, and mackerel.  Limit how much you eat of the following: ? Canned or prepackaged foods. ? Food that is high in trans fat, such as fried foods. ? Food that is high in saturated fat, such as fatty meat. ? Sweets, desserts, sugary drinks, and other foods with added sugar. ? Full-fat dairy products.  Do not salt foods before eating.  Try to eat at least 2 vegetarian meals each week.  Eat more home-cooked food and less restaurant, buffet, and fast food.  When eating at a restaurant, ask that your food be prepared with less salt or no salt, if possible. What foods are recommended? The items listed may not be a complete list. Talk with your dietitian about what   dietary choices are best for you. Grains Whole-grain or whole-wheat bread. Whole-grain or whole-wheat pasta. Brown rice. Oatmeal. Quinoa. Bulgur. Whole-grain and low-sodium cereals. Pita bread. Low-fat, low-sodium crackers. Whole-wheat flour tortillas. Vegetables Fresh or frozen vegetables (raw, steamed, roasted, or grilled). Low-sodium or reduced-sodium tomato and vegetable juice. Low-sodium or reduced-sodium tomato sauce and tomato paste. Low-sodium or reduced-sodium canned vegetables. Fruits All fresh, dried, or frozen fruit. Canned fruit in natural juice (without  added sugar). Meat and other protein foods Skinless chicken or turkey. Ground chicken or turkey. Pork with fat trimmed off. Fish and seafood. Egg whites. Dried beans, peas, or lentils. Unsalted nuts, nut butters, and seeds. Unsalted canned beans. Lean cuts of beef with fat trimmed off. Low-sodium, lean deli meat. Dairy Low-fat (1%) or fat-free (skim) milk. Fat-free, low-fat, or reduced-fat cheeses. Nonfat, low-sodium ricotta or cottage cheese. Low-fat or nonfat yogurt. Low-fat, low-sodium cheese. Fats and oils Soft margarine without trans fats. Vegetable oil. Low-fat, reduced-fat, or light mayonnaise and salad dressings (reduced-sodium). Canola, safflower, olive, soybean, and sunflower oils. Avocado. Seasoning and other foods Herbs. Spices. Seasoning mixes without salt. Unsalted popcorn and pretzels. Fat-free sweets. What foods are not recommended? The items listed may not be a complete list. Talk with your dietitian about what dietary choices are best for you. Grains Baked goods made with fat, such as croissants, muffins, or some breads. Dry pasta or rice meal packs. Vegetables Creamed or fried vegetables. Vegetables in a cheese sauce. Regular canned vegetables (not low-sodium or reduced-sodium). Regular canned tomato sauce and paste (not low-sodium or reduced-sodium). Regular tomato and vegetable juice (not low-sodium or reduced-sodium). Pickles. Olives. Fruits Canned fruit in a light or heavy syrup. Fried fruit. Fruit in cream or butter sauce. Meat and other protein foods Fatty cuts of meat. Ribs. Fried meat. Bacon. Sausage. Bologna and other processed lunch meats. Salami. Fatback. Hotdogs. Bratwurst. Salted nuts and seeds. Canned beans with added salt. Canned or smoked fish. Whole eggs or egg yolks. Chicken or turkey with skin. Dairy Whole or 2% milk, cream, and half-and-half. Whole or full-fat cream cheese. Whole-fat or sweetened yogurt. Full-fat cheese. Nondairy creamers. Whipped toppings.  Processed cheese and cheese spreads. Fats and oils Butter. Stick margarine. Lard. Shortening. Ghee. Bacon fat. Tropical oils, such as coconut, palm kernel, or palm oil. Seasoning and other foods Salted popcorn and pretzels. Onion salt, garlic salt, seasoned salt, table salt, and sea salt. Worcestershire sauce. Tartar sauce. Barbecue sauce. Teriyaki sauce. Soy sauce, including reduced-sodium. Steak sauce. Canned and packaged gravies. Fish sauce. Oyster sauce. Cocktail sauce. Horseradish that you find on the shelf. Ketchup. Mustard. Meat flavorings and tenderizers. Bouillon cubes. Hot sauce and Tabasco sauce. Premade or packaged marinades. Premade or packaged taco seasonings. Relishes. Regular salad dressings. Where to find more information:  National Heart, Lung, and Blood Institute: www.nhlbi.nih.gov  American Heart Association: www.heart.org Summary  The DASH eating plan is a healthy eating plan that has been shown to reduce high blood pressure (hypertension). It may also reduce your risk for type 2 diabetes, heart disease, and stroke.  With the DASH eating plan, you should limit salt (sodium) intake to 2,300 mg a day. If you have hypertension, you may need to reduce your sodium intake to 1,500 mg a day.  When on the DASH eating plan, aim to eat more fresh fruits and vegetables, whole grains, lean proteins, low-fat dairy, and heart-healthy fats.  Work with your health care provider or diet and nutrition specialist (dietitian) to adjust your eating plan to your individual   calorie needs. This information is not intended to replace advice given to you by your health care provider. Make sure you discuss any questions you have with your health care provider. Document Released: 03/01/2011 Document Revised: 03/05/2016 Document Reviewed: 03/05/2016 Elsevier Interactive Patient Education  2017 [employer] Inc.  

## 2016-05-31 ENCOUNTER — Other Ambulatory Visit: Payer: Self-pay | Admitting: *Deleted

## 2016-06-16 DIAGNOSIS — Z0100 Encounter for examination of eyes and vision without abnormal findings: Secondary | ICD-10-CM | POA: Diagnosis not present

## 2016-07-16 ENCOUNTER — Other Ambulatory Visit: Payer: Self-pay | Admitting: Nurse Practitioner

## 2016-07-16 ENCOUNTER — Other Ambulatory Visit: Payer: Self-pay

## 2016-07-16 MED ORDER — PRAVASTATIN SODIUM 40 MG PO TABS: 40.0000 mg | ORAL_TABLET | Freq: Every day | ORAL | 1 refills | Status: DC

## 2016-07-16 NOTE — Telephone Encounter (Signed)
Medication request from pharmacy

## 2016-08-09 DIAGNOSIS — Z6831 Body mass index (BMI) 31.0-31.9, adult: Secondary | ICD-10-CM | POA: Diagnosis not present

## 2016-08-09 DIAGNOSIS — Z1231 Encounter for screening mammogram for malignant neoplasm of breast: Secondary | ICD-10-CM | POA: Diagnosis not present

## 2016-08-09 DIAGNOSIS — Z124 Encounter for screening for malignant neoplasm of cervix: Secondary | ICD-10-CM | POA: Diagnosis not present

## 2016-08-25 DIAGNOSIS — Z0100 Encounter for examination of eyes and vision without abnormal findings: Secondary | ICD-10-CM | POA: Diagnosis not present

## 2016-08-30 ENCOUNTER — Ambulatory Visit: Payer: Medicare Other | Admitting: Nurse Practitioner

## 2016-08-30 ENCOUNTER — Encounter: Payer: Self-pay | Admitting: Nurse Practitioner

## 2016-08-30 ENCOUNTER — Ambulatory Visit: Payer: Medicare HMO

## 2016-09-08 ENCOUNTER — Other Ambulatory Visit: Payer: Self-pay | Admitting: Nurse Practitioner

## 2016-09-08 DIAGNOSIS — R000 Tachycardia, unspecified: Secondary | ICD-10-CM

## 2016-09-08 DIAGNOSIS — I10 Essential (primary) hypertension: Secondary | ICD-10-CM

## 2016-09-17 ENCOUNTER — Encounter: Payer: Self-pay | Admitting: Nurse Practitioner

## 2016-09-17 ENCOUNTER — Ambulatory Visit (INDEPENDENT_AMBULATORY_CARE_PROVIDER_SITE_OTHER): Payer: Medicare HMO | Admitting: Nurse Practitioner

## 2016-09-17 VITALS — BP 134/78 | HR 67 | Temp 98.2°F | Resp 17 | Ht 64.0 in | Wt 176.2 lb

## 2016-09-17 DIAGNOSIS — I10 Essential (primary) hypertension: Secondary | ICD-10-CM

## 2016-09-17 DIAGNOSIS — E785 Hyperlipidemia, unspecified: Secondary | ICD-10-CM

## 2016-09-17 DIAGNOSIS — R000 Tachycardia, unspecified: Secondary | ICD-10-CM

## 2016-09-17 MED ORDER — PRAVASTATIN SODIUM 40 MG PO TABS: 40.0000 mg | ORAL_TABLET | Freq: Every day | ORAL | 1 refills | Status: DC

## 2016-09-17 NOTE — Patient Instructions (Addendum)
Make appt for fasting blood work.  6 months for routine follow up  Christine Terrell stands for "Christine Terrell." The Christine Terrell eating plan is a healthy eating plan that has been shown to reduce high blood pressure (Terrell). It may also reduce your risk for type 2 diabetes, heart disease, and stroke. The Christine Terrell eating plan may also help with weight loss. What are tips for following this plan? General guidelines  Avoid eating more than 2,300 mg (milligrams) of salt (sodium) a day. If you have Terrell, you may need to reduce your sodium intake to 1,500 mg a day.  Limit alcohol intake to no more than 1 drink a day for nonpregnant women and 2 drinks a day for men. One drink equals 12 oz of beer, 5 oz of wine, or 1 oz of hard liquor.  Work with your health care provider to maintain a healthy body weight or to lose weight. Ask what an ideal weight is for you.  Get at least 30 minutes of exercise that causes your heart to beat faster (aerobic exercise) most days of the week. Activities may include walking, swimming, or biking.  Work with your health care provider or diet and nutrition specialist (dietitian) to adjust your eating plan to your individual calorie needs. Reading food labels  Check food labels for the amount of sodium per serving. Choose foods with less than 5 percent of the Daily Value of sodium. Generally, foods with less than 300 mg of sodium per serving fit into this eating plan.  To find whole grains, look for the word "whole" as the first word in the ingredient list. Shopping  Buy products labeled as "low-sodium" or "no salt added."  Buy fresh foods. Avoid canned foods and premade or frozen meals. Cooking  Avoid adding salt when cooking. Use salt-free seasonings or herbs instead of table salt or sea salt. Check with your health care provider or pharmacist before using salt substitutes.  Do not fry foods. Cook foods using healthy methods such  as baking, boiling, grilling, and broiling instead.  Cook with heart-healthy oils, such as olive, canola, soybean, or sunflower oil. Meal planning   Eat a balanced diet that includes: ? 5 or more servings of fruits and vegetables each day. At each meal, try to fill half of your plate with fruits and vegetables. ? Up to 6-8 servings of whole grains each day. ? Less than 6 oz of lean meat, poultry, or fish each day. A 3-oz serving of meat is about the same size as a deck of cards. One egg equals 1 oz. ? 2 servings of low-fat dairy each day. ? A serving of nuts, seeds, or beans 5 times each week. ? Heart-healthy fats. Healthy fats called Omega-3 fatty acids are found in foods such as flaxseeds and coldwater fish, like sardines, salmon, and mackerel.  Limit how much you eat of the following: ? Canned or prepackaged foods. ? Food that is high in trans fat, such as fried foods. ? Food that is high in saturated fat, such as fatty meat. ? Sweets, desserts, sugary drinks, and other foods with added sugar. ? Full-fat dairy products.  Do not salt foods before eating.  Try to eat at least 2 vegetarian meals each week.  Eat more home-cooked food and less restaurant, buffet, and fast food.  When eating at a restaurant, ask that your food be prepared with less salt or no salt, if possible. What foods are recommended? The  items listed may not be a complete list. Talk with your dietitian about what Christine choices are best for you. Grains Whole-grain or whole-wheat bread. Whole-grain or whole-wheat pasta. Brown rice. Christine Terrell. Bulgur. Whole-grain and low-sodium cereals. Pita bread. Low-fat, low-sodium crackers. Whole-wheat flour tortillas. Vegetables Fresh or frozen vegetables (raw, steamed, roasted, or grilled). Low-sodium or reduced-sodium tomato and vegetable juice. Low-sodium or reduced-sodium tomato sauce and tomato paste. Low-sodium or reduced-sodium canned vegetables. Fruits All  fresh, dried, or frozen fruit. Canned fruit in natural juice (without added sugar). Meat and other protein foods Skinless chicken or [place]. Ground chicken or [place]. Pork with fat trimmed off. Fish and seafood. Egg whites. Dried beans, peas, or lentils. Unsalted nuts, nut butters, and seeds. Unsalted canned beans. Lean cuts of beef with fat trimmed off. Low-sodium, lean deli meat. Dairy Low-fat (1%) or fat-free (skim) milk. Fat-free, low-fat, or reduced-fat cheeses. Nonfat, low-sodium ricotta or cottage cheese. Low-fat or nonfat yogurt. Low-fat, low-sodium cheese. Fats and oils Soft margarine without trans fats. Vegetable oil. Low-fat, reduced-fat, or light mayonnaise and salad dressings (reduced-sodium). Canola, safflower, olive, soybean, and sunflower oils. Avocado. Seasoning and other foods Herbs. Spices. Seasoning mixes without salt. Unsalted popcorn and pretzels. Fat-free sweets. What foods are not recommended? The items listed may not be a complete list. Talk with your dietitian about what Christine choices are best for you. Grains Baked goods made with fat, such as croissants, muffins, or some breads. Dry pasta or rice meal packs. Vegetables Creamed or fried vegetables. Vegetables in a cheese sauce. Regular canned vegetables (not low-sodium or reduced-sodium). Regular canned tomato sauce and paste (not low-sodium or reduced-sodium). Regular tomato and vegetable juice (not low-sodium or reduced-sodium). Christine Terrell. Olives. Fruits Canned fruit in a light or heavy syrup. Fried fruit. Fruit in cream or butter sauce. Meat and other protein foods Fatty cuts of meat. Ribs. Fried meat. Christine Terrell. Sausage. Bologna and other processed lunch meats. Salami. Fatback. Hotdogs. Bratwurst. Salted nuts and seeds. Canned beans with added salt. Canned or smoked fish. Whole eggs or egg yolks. Chicken or [place] with skin. Dairy Whole or 2% milk, cream, and half-and-half. Whole or full-fat cream cheese. Whole-fat or  sweetened yogurt. Full-fat cheese. Nondairy creamers. Whipped toppings. Processed cheese and cheese spreads. Fats and oils Butter. Stick margarine. Lard. Shortening. Ghee. Bacon fat. Tropical oils, such as coconut, palm kernel, or palm oil. Seasoning and other foods Salted popcorn and pretzels. Onion salt, garlic salt, seasoned salt, table salt, and sea salt. Worcestershire sauce. Tartar sauce. Barbecue sauce. Teriyaki sauce. Soy sauce, including reduced-sodium. Steak sauce. Canned and packaged gravies. Fish sauce. Oyster sauce. Cocktail sauce. Horseradish that you find on the shelf. Ketchup. Mustard. Meat flavorings and tenderizers. Bouillon cubes. Hot sauce and Tabasco sauce. Premade or packaged marinades. Premade or packaged taco seasonings. Relishes. Regular salad dressings. Where to find more information:  National Heart, Lung, and [place]: https://wilson-eaton.com/  American Heart Association: www.heart.org Summary  The Christine Terrell eating plan is a healthy eating plan that has been shown to reduce high blood pressure (Terrell). It may also reduce your risk for type 2 diabetes, heart disease, and stroke.  With the Christine Terrell eating plan, you should limit salt (sodium) intake to 2,300 mg a day. If you have Terrell, you may need to reduce your sodium intake to 1,500 mg a day.  When on the Christine Terrell eating plan, aim to eat more fresh fruits and vegetables, whole grains, lean proteins, low-fat dairy, and heart-healthy fats.  Work with your health care provider  or diet and nutrition specialist (dietitian) to adjust your eating plan to your individual calorie needs. This information is not intended to replace advice given to you by your health care provider. Make sure you discuss any questions you have with your health care provider. Document Released: 03/01/2011 Document Revised: 03/05/2016 Document Reviewed: 03/05/2016 Elsevier Interactive Patient Education  2017 [employer].

## 2016-09-17 NOTE — Progress Notes (Signed)
Careteam: Patient Care Team: Lauree Chandler, NP as PCP - General (Geriatric Medicine)  Advanced Directive information Does Patient Have a Medical Advance Directive?: No  No Known Allergies  Chief Complaint  Patient presents with  . Medical Management of Chronic Issues    Pt is being seen for a 3 month routine follow up.      HPI: Patient is a 46 y.o. female seen in the office today for routine follow up.  Here with mother Reports she is in a new day program. Enjoying this.  No exercise.  Attempts to eat healthy. Her mother and her were trying to do better but things have fallen off somewhat.  Weight down 4 lbs from 3 months ago.  No problems with OAB. Recently had Mammogram and PAP at GYN Recent visit with ophthalmology, got new glasses Review of Systems:  Review of Systems  Constitutional: Negative for chills, fever and weight loss.  HENT: Negative for tinnitus.   Respiratory: Negative for cough, sputum production and shortness of breath.   Cardiovascular: Negative for chest pain, palpitations and leg swelling.  Gastrointestinal: Negative for abdominal pain, constipation, diarrhea and heartburn.  Genitourinary: Negative for dysuria, frequency and urgency.  Musculoskeletal: Negative for back pain, falls, joint pain and myalgias.  Skin: Negative.   Neurological: Negative for dizziness and headaches.  Psychiatric/Behavioral: Negative for depression and memory loss. The patient does not have insomnia.     Past Medical History:  Diagnosis Date  . Hyperlipidemia   . Overactive bladder   . Recurrent UTI    Past Surgical History:  Procedure Laterality Date  . uretral stent     Social History:   reports that she has never smoked. She has never used smokeless tobacco. She reports that she does not drink alcohol or use drugs.  Family History  Problem Relation Age of Onset  . Hypertension Mother   . Arthritis Mother   . Cancer Father 26       stomach     Medications: Patient's Medications  New Prescriptions   No medications on file  Previous Medications   CHOLECALCIFEROL (D3 SUPER STRENGTH) 2000 UNITS CAPS    Take 2,000 Units by mouth daily.   LORATADINE (CLARITIN) 10 MG TABLET    TAKE 1 TABLET (10 MG TOTAL) BY MOUTH DAILY.   MEDROXYPROGESTERONE (DEPO-PROVERA) 150 MG/ML INJECTION    Inject 1 mL (150 mg total) into the muscle every 3 (three) months.   METOPROLOL TARTRATE (LOPRESSOR) 50 MG TABLET    TAKE 1 TABLET (50 MG TOTAL) BY MOUTH 2 (TWO) TIMES DAILY.   MULTIVITAMIN-IRON-MINERALS-FOLIC ACID (CENTRUM) CHEWABLE TABLET    Chew 2 tablets by mouth daily.   PRAVASTATIN (PRAVACHOL) 40 MG TABLET    Take 1 tablet (40 mg total) by mouth daily.  Modified Medications   No medications on file  Discontinued Medications   No medications on file     Physical Exam:  Vitals:   09/17/16 1537  BP: 134/78  Pulse: 67  Resp: 17  Temp: 98.2 F (36.8 C)  TempSrc: Oral  SpO2: 97%  Weight: 176 lb 3.2 oz (79.9 kg)  Height: _0  (1.626 m)   Body mass index is 30.24 kg/m.  Physical Exam  Constitutional: She is oriented to person, place, and time. She appears well-developed and well-nourished. No distress.  HENT:  Head: Normocephalic and atraumatic.  Eyes: Conjunctivae and EOM are normal. Pupils are equal, round, and reactive to light.  Neck: Normal range of  motion. Neck supple.  Cardiovascular: Normal rate and regular rhythm.   No murmur heard. Pulmonary/Chest: Effort normal and breath sounds normal. No respiratory distress.  Abdominal: Soft. Bowel sounds are normal.  Musculoskeletal: She exhibits no edema or tenderness.  Neurological: She is alert and oriented to person, place, and time.  Skin: Skin is warm and dry.  Psychiatric: She has a normal mood and affect.    Labs reviewed: Basic Metabolic Panel:  Recent Labs  01/12/16 1605 01/12/16 1612 04/30/16 0818 05/25/16 0856  NA 138  --  138 140  K 3.7  --  3.7 4.0  CL 104  --   103 108  CO2 26  --  24 23  GLUCOSE 116*  --  191* 104*  BUN 14  --  21 11  CREATININE 1.14*  --  1.34* 1.05  CALCIUM 10.0  --  9.8 9.4  TSH  --  1.08  --   --    Liver Function Tests:  Recent Labs  10/03/15 0852 04/30/16 0818  AST  --  13  ALT 14 13  ALKPHOS  --  60  BILITOT  --  1.6*  PROT  --  7.0  ALBUMIN  --  4.1   No results for input(s): LIPASE, AMYLASE in the last 8760 hours. No results for input(s): AMMONIA in the last 8760 hours. CBC:  Recent Labs  04/30/16 0820  WBC 5.3  NEUTROABS 3,127  HGB 12.7  HCT 38.3  MCV 107.3*  PLT 274   Lipid Panel:  Recent Labs  10/03/15 0854  CHOL 148  HDL 50  LDLCALC 90  TRIG 38  CHOLHDL 3.0   TSH:  Recent Labs  01/12/16 1612  TSH 1.08   A1C: Lab Results  Component Value Date   HGBA1C 5.5 07/04/2015     Assessment/Plan 1. Essential hypertension Stable on lopressor, will cont current dose. Encouraged dietary modifications and to increase exercise as tolerates  2. Tachycardia Stable, none at this time. conts on lopressor   3. Hyperlipidemia, unspecified hyperlipidemia type Lifestyle modifications encouraged. Cont on pravastatin.  - pravastatin (PRAVACHOL) 40 MG tablet; Take 1 tablet (40 mg total) by mouth daily.  Dispense: 90 tablet; Refill: 1 - Lipid Panel; Future - CMP with eGFR; Future  Follow up in  16month for RV and AWV prior  Jessica K. [facility]Harle [facility] [phone] am - 5 pm) [phone](after hours)

## 2016-10-02 ENCOUNTER — Other Ambulatory Visit: Payer: Medicare HMO | Admitting: Nurse Practitioner

## 2016-10-02 ENCOUNTER — Other Ambulatory Visit: Payer: Medicare HMO

## 2016-10-02 DIAGNOSIS — E785 Hyperlipidemia, unspecified: Secondary | ICD-10-CM | POA: Diagnosis not present

## 2016-10-03 LAB — COMPLETE METABOLIC PANEL WITH GFR
ALT: 10 U/L (ref 6–29)
AST: 13 U/L (ref 10–30)
Albumin: 4.1 g/dL (ref 3.6–5.1)
Alkaline Phosphatase: 68 U/L (ref 33–115)
BUN: 14 mg/dL (ref 7–25)
CO2: 21 mmol/L (ref 20–31)
Calcium: 9.7 mg/dL (ref 8.6–10.2)
Chloride: 109 mmol/L (ref 98–110)
Creat: 1.17 mg/dL — ABNORMAL HIGH (ref 0.50–1.10)
GFR, Est African American: 66 mL/min (ref >=60)
GFR, Est Non African American: 57 mL/min — ABNORMAL LOW (ref >=60)
Glucose, Bld: 117 mg/dL — ABNORMAL HIGH (ref 65–99)
Potassium: 4 mmol/L (ref 3.5–5.3)
Sodium: 141 mmol/L (ref 135–146)
Total Bilirubin: 1.6 mg/dL — ABNORMAL HIGH (ref 0.2–1.2)
Total Protein: 6.9 g/dL (ref 6.1–8.1)

## 2016-10-03 LAB — LIPID PANEL
Cholesterol: 140 mg/dL (ref <200)
HDL: 47 mg/dL — ABNORMAL LOW (ref >50)
LDL Cholesterol: 84 mg/dL (ref <100)
Total CHOL/HDL Ratio: 3 Ratio (ref <5.0)
Triglycerides: 43 mg/dL (ref <150)
VLDL: 9 mg/dL (ref <30)

## 2016-10-13 DIAGNOSIS — E669 Obesity, unspecified: Secondary | ICD-10-CM | POA: Diagnosis not present

## 2016-10-13 DIAGNOSIS — R69 Illness, unspecified: Secondary | ICD-10-CM | POA: Diagnosis not present

## 2016-10-13 DIAGNOSIS — I10 Essential (primary) hypertension: Secondary | ICD-10-CM | POA: Diagnosis not present

## 2016-10-13 DIAGNOSIS — J309 Allergic rhinitis, unspecified: Secondary | ICD-10-CM | POA: Diagnosis not present

## 2016-10-13 DIAGNOSIS — Z6830 Body mass index (BMI) 30.0-30.9, adult: Secondary | ICD-10-CM | POA: Diagnosis not present

## 2016-10-13 DIAGNOSIS — Z0000 Encounter for general adult medical examination without abnormal findings: Secondary | ICD-10-CM | POA: Diagnosis not present

## 2016-10-13 DIAGNOSIS — E785 Hyperlipidemia, unspecified: Secondary | ICD-10-CM | POA: Diagnosis not present

## 2016-10-25 DIAGNOSIS — Z3042 Encounter for surveillance of injectable contraceptive: Secondary | ICD-10-CM | POA: Diagnosis not present

## 2016-10-31 IMAGING — MG MM DIGITAL DIAGNOSTIC UNILAT R
3 series · 3 of 3 positions shown · non-contrast
Comparison: 07/26/2015

ADDENDUM:
We received prior mammograms dated 11/20/2014 and July 31, 2013 from
[REDACTED] in [HOSPITAL], [place]. These were compared to the
patient's recent mammogram of 08/20/2014 and 07/26/2015.

The scattered punctate and round calcifications identified in the
right breast on the recent mammograms in July 2015 appear unchanged
compared to prior studies of 0308 and 6259 and have benign
appearances.
CLINICAL DATA: 44-year-old patient recalled for calcifications in
the right breast. Prior to the screening mammogram dated 07/26/2015,
the patient had previous mammogram(s) performed in [place]. The
patient presents with her mother today. The patient's mother will
contact the [REDACTED] where the patient lived in [place] to
obtain the name of the office in [place] where the prior
mammogram was performed, and call us with this information.
EXAM:
DIGITAL DIAGNOSTIC RIGHT MAMMOGRAM

[R ML (1 of 2)]
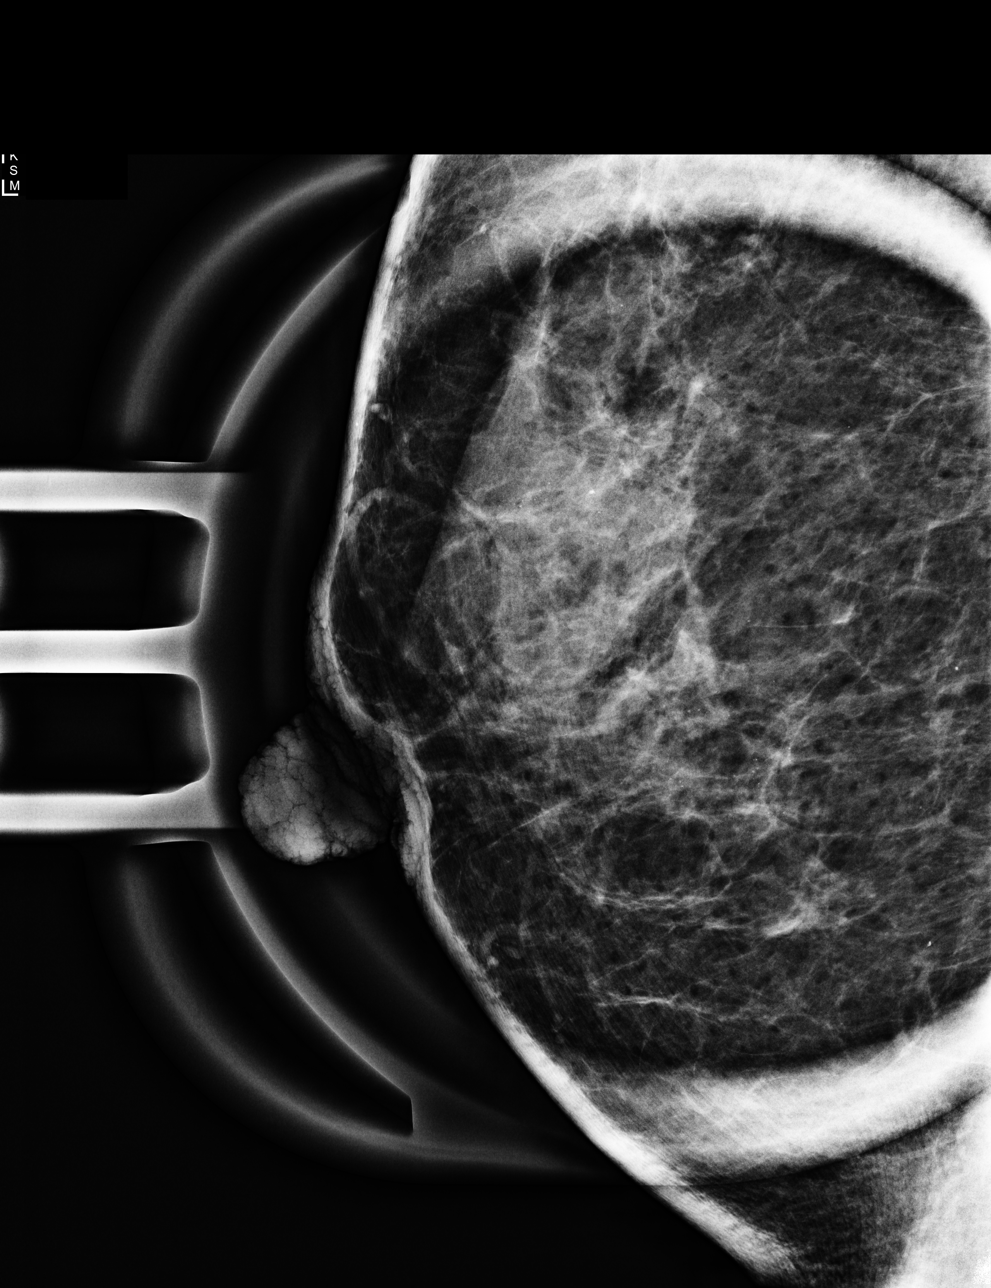

[R ML (2 of 2)]
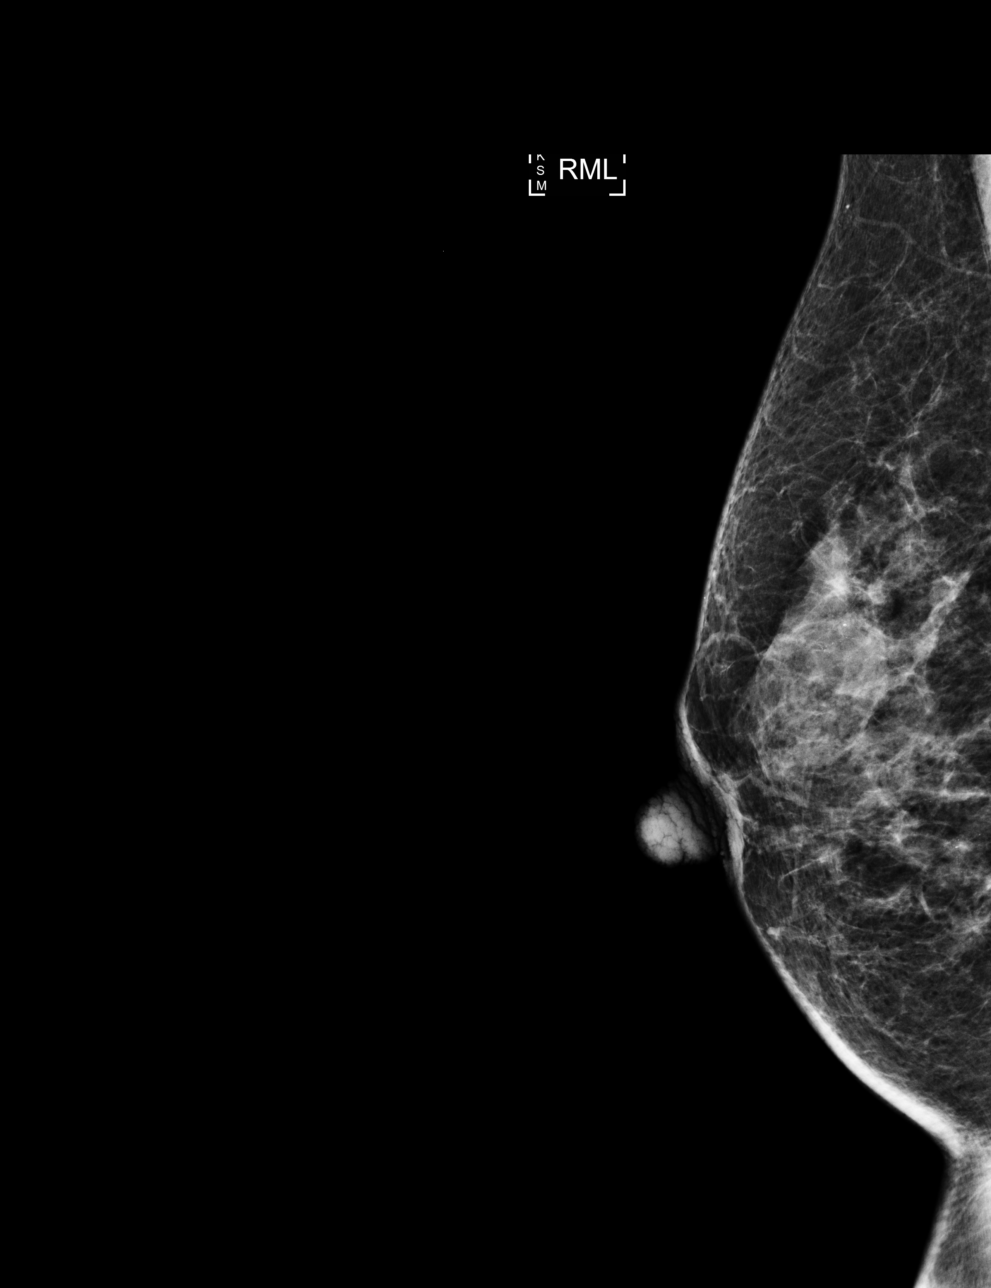

[R CC]
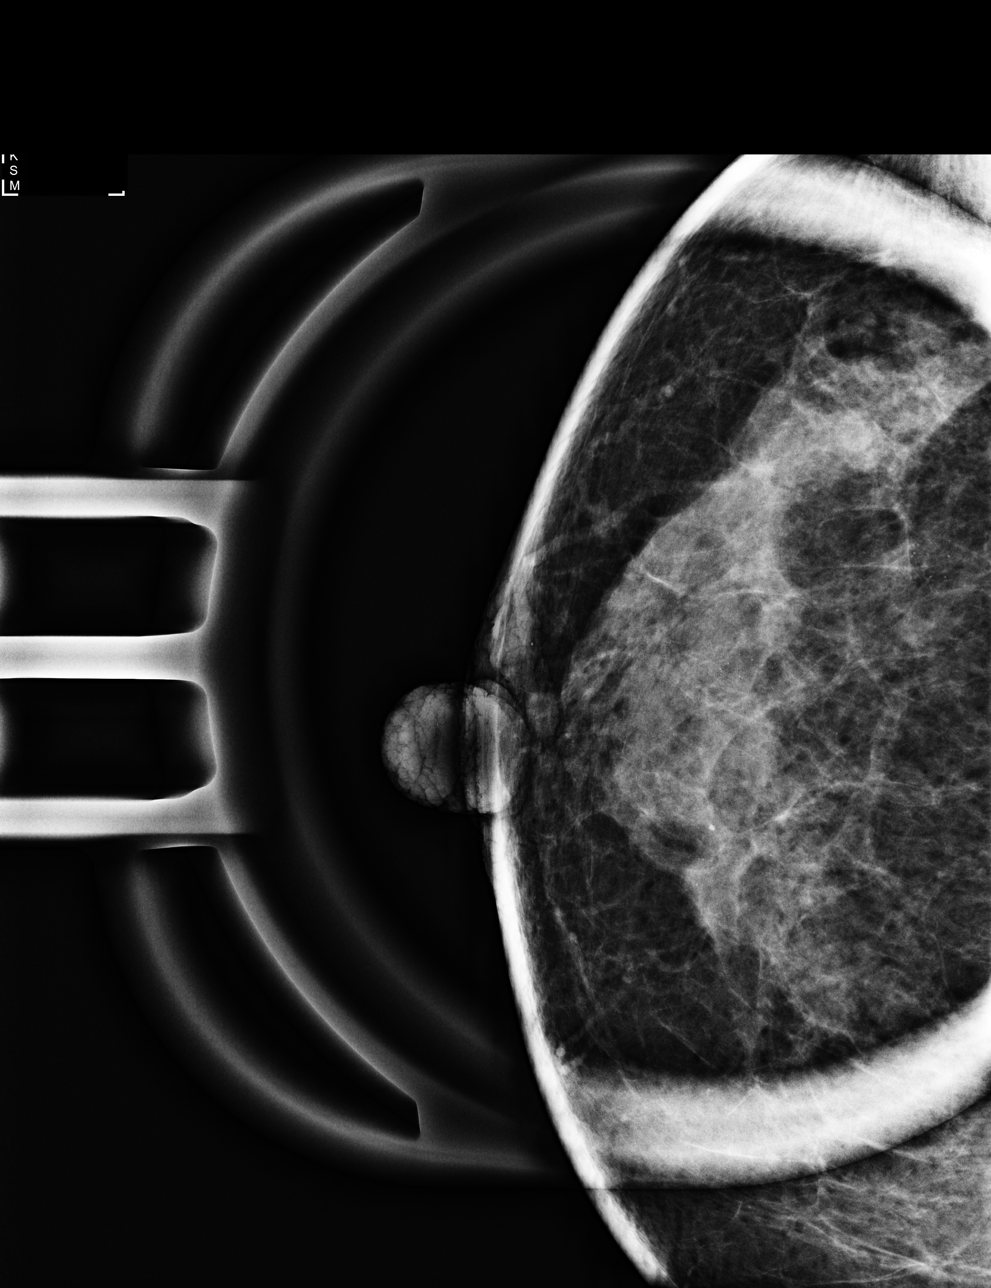

[3 of 3 positions shown; findings below may reference images not displayed]

IMPRESSION: no evidence of malignancy in the right breast. Stable
benign-appearing scattered calcifications.

Recommendation: Screening mammogram in one year.(Code:FY-R-DX0)
ACR Breast Density Category c: The breast tissue is heterogeneously
dense, which may obscure small masses.
FINDINGS: Magnification views of the central, outer, slightly medial, and
upper right breast show diffusely scattered punctate and round
calcifications. These do not layer on the 90 degrees lateral view.
IMPRESSION: Scattered punctate around calcifications in the right breast.

RECOMMENDATION:
Our office will attempt to obtain the prior mammograms from [place] for comparison. Once they are received and reviewed, an
addendum will be issued to this report. This was discussed in detail
with the patient's mother today.

I have discussed the findings and recommendations with the patient.
Results were also provided in writing at the conclusion of the
visit. If applicable, a reminder letter will be sent to the patient
regarding the next appointment.

BI-RADS CATEGORY  0: Incomplete. Need additional imaging evaluation
and/or prior mammograms for comparison.

## 2016-12-03 ENCOUNTER — Other Ambulatory Visit: Payer: Self-pay | Admitting: Nurse Practitioner

## 2016-12-03 DIAGNOSIS — I10 Essential (primary) hypertension: Secondary | ICD-10-CM

## 2016-12-03 DIAGNOSIS — R000 Tachycardia, unspecified: Secondary | ICD-10-CM

## 2017-01-10 DIAGNOSIS — Z3042 Encounter for surveillance of injectable contraceptive: Secondary | ICD-10-CM | POA: Diagnosis not present

## 2017-03-04 ENCOUNTER — Ambulatory Visit: Payer: Medicare HMO | Admitting: Nurse Practitioner

## 2017-03-08 ENCOUNTER — Ambulatory Visit: Payer: Self-pay | Admitting: Nurse Practitioner

## 2017-03-12 ENCOUNTER — Encounter: Payer: Self-pay | Admitting: Nurse Practitioner

## 2017-03-12 ENCOUNTER — Ambulatory Visit (INDEPENDENT_AMBULATORY_CARE_PROVIDER_SITE_OTHER): Payer: Medicare HMO | Admitting: Nurse Practitioner

## 2017-03-12 VITALS — BP 128/80 | HR 86 | Temp 98.0°F | Ht 64.0 in | Wt 165.0 lb

## 2017-03-12 DIAGNOSIS — F79 Unspecified intellectual disabilities: Secondary | ICD-10-CM | POA: Diagnosis not present

## 2017-03-12 DIAGNOSIS — D509 Iron deficiency anemia, unspecified: Secondary | ICD-10-CM

## 2017-03-12 DIAGNOSIS — Z23 Encounter for immunization: Secondary | ICD-10-CM

## 2017-03-12 DIAGNOSIS — I10 Essential (primary) hypertension: Secondary | ICD-10-CM

## 2017-03-12 DIAGNOSIS — R739 Hyperglycemia, unspecified: Secondary | ICD-10-CM | POA: Diagnosis not present

## 2017-03-12 DIAGNOSIS — R69 Illness, unspecified: Secondary | ICD-10-CM | POA: Diagnosis not present

## 2017-03-12 DIAGNOSIS — E785 Hyperlipidemia, unspecified: Secondary | ICD-10-CM | POA: Diagnosis not present

## 2017-03-12 NOTE — Progress Notes (Signed)
Careteam: Patient Care Team: Lauree Chandler, NP as PCP - General (Geriatric Medicine)  Advanced Directive information    No Known Allergies  Chief Complaint  Patient presents with  . Medical Management of Chronic Issues    3 month follow-up      HPI: Patient is a 46 y.o. female seen in the office today for routine follow up.  No exercise or changes in diet since last visit.    hyperlipidemia- taking Pravachol  htn- using metoprolol 50 mg BID. Has not made diet changes, no exercises.   Needs flu vaccine.   Had PAP and mammogram and [facility] Went to dentist after she had a fall in the bathtub- mat was not down- she chipped tooth. Otherwise no pain or injury noted.   Continues to be active in the day program but no doing exercises  Mother reports extended family moved out so less junk food in house- eating better.  Review of Systems:  Review of Systems  Constitutional: Negative for chills, fever and weight loss.  HENT: Negative for tinnitus.   Respiratory: Negative for cough, sputum production and shortness of breath.   Cardiovascular: Negative for chest pain, palpitations and leg swelling.  Gastrointestinal: Negative for abdominal pain, constipation, diarrhea and heartburn.  Genitourinary: Negative for dysuria, frequency and urgency.  Musculoskeletal: Negative for back pain, falls, joint pain and myalgias.  Skin: Negative.   Neurological: Negative for dizziness and headaches.  Psychiatric/Behavioral: Negative for depression and memory loss. The patient does not have insomnia.     Past Medical History:  Diagnosis Date  . Hyperlipidemia   . Overactive bladder   . Recurrent UTI    Past Surgical History:  Procedure Laterality Date  . uretral stent     Social History:   reports that  has never smoked. she has never used smokeless tobacco. She reports that she does not drink alcohol or use drugs.  Family History  Problem Relation Age of Onset    . Hypertension Mother   . Arthritis Mother   . Cancer Father 47       stomach    Medications:   Medication List        Accurate as of 03/12/17  4:12 PM. Always use your most recent med list.          D3 SUPER STRENGTH 2000 units Caps Generic drug:  Cholecalciferol   medroxyPROGESTERone 150 MG/ML injection Commonly known as:  DEPO-PROVERA Inject 1 mL (150 mg total) into the muscle every 3 (three) months.   metoprolol tartrate 50 MG tablet Commonly known as:  LOPRESSOR TAKE 1 TABLET (50 MG TOTAL) BY MOUTH 2 (TWO) TIMES DAILY.   multivitamin-iron-minerals-folic acid chewable tablet   pravastatin 40 MG tablet Commonly known as:  PRAVACHOL Take 1 tablet (40 mg total) by mouth daily.        Physical Exam:  Vitals:   03/12/17 1513  BP: 128/80  Pulse: 86  Temp: 98 F (36.7 C)  TempSrc: Oral  SpO2: 99%  Weight: 165 lb (74.8 kg)  Height: '5\' 4"'  (1.626 m)   Body mass index is 28.32 kg/m.  Physical Exam  Constitutional: She is oriented to person, place, and time. She appears well-developed and well-nourished. No distress.  HENT:  Head: Normocephalic and atraumatic.  Eyes: Conjunctivae and EOM are normal. Pupils are equal, round, and reactive to light.  Neck: Normal range of motion. Neck supple.  Cardiovascular: Normal rate and regular rhythm.  No murmur  heard. Pulmonary/Chest: Effort normal and breath sounds normal. No respiratory distress.  Abdominal: Soft. Bowel sounds are normal.  Musculoskeletal: She exhibits no edema or tenderness.  Neurological: She is alert and oriented to person, place, and time.  Skin: Skin is warm and dry.  Psychiatric: She has a normal mood and affect.    Labs reviewed: Basic Metabolic Panel: Recent Labs    04/30/16 0818 05/25/16 0856 10/02/16 0836  NA 138 140 141  K 3.7 4.0 4.0  CL 103 108 109  CO2 '24 23 21  ' GLUCOSE 191* 104* 117*  BUN '21 11 14  ' CREATININE 1.34* 1.05 1.17*  CALCIUM 9.8 9.4 9.7   Liver Function  Tests: Recent Labs    04/30/16 0818 10/02/16 0836  AST 13 13  ALT 13 10  ALKPHOS 60 68  BILITOT 1.6* 1.6*  PROT 7.0 6.9  ALBUMIN 4.1 4.1   No results for input(s): LIPASE, AMYLASE in the last 8760 hours. No results for input(s): AMMONIA in the last 8760 hours. CBC: Recent Labs    04/30/16 0820  WBC 5.3  NEUTROABS 3,127  HGB 12.7  HCT 38.3  MCV 107.3*  PLT 274   Lipid Panel: Recent Labs    10/02/16 0836  CHOL 140  HDL 47*  LDLCALC 84  TRIG 43  CHOLHDL 3.0   TSH: No results for input(s): TSH in the last 8760 hours. A1C: Lab Results  Component Value Date   HGBA1C 5.5 07/04/2015     Assessment/Plan 1. Essential hypertension -blood pressure stable, will cont metoprolol BID. Dash diet encouraged.   2. Hyperlipidemia, unspecified hyperlipidemia type conts on Pravachol. Encouraged dietary compliance as well.  - CMP with eGFR  3. Hyperglycemia -has lost weight since last OV, eating better due to less junk food at house.  - Hemoglobin A1c  4. Intellectual disability Continues with day program and lives with mother. Doing well at this time.   5. Iron deficiency anemia, unspecified iron deficiency anemia type -not on supplements at this time, will follow up - CBC with Differential/Platelets  6. Need for immunization against influenza - Flu Vaccine QUAD 36+ mos IM  Next appt: 6 months, sooner if needed Walt Geathers K. Harle Battiest  [facility] [phone] 8 am - 5 pm) [phone] (after hours)

## 2017-03-12 NOTE — Patient Instructions (Signed)
Try to encourage activity   DASH Eating Plan DASH stands for "Dietary Approaches to Stop Hypertension." The DASH eating plan is a healthy eating plan that has been shown to reduce high blood pressure (hypertension). It may also reduce your risk for type 2 diabetes, heart disease, and stroke. The DASH eating plan may also help with weight loss. What are tips for following this plan? General guidelines  Avoid eating more than 2,300 mg (milligrams) of salt (sodium) a day. If you have hypertension, you may need to reduce your sodium intake to 1,500 mg a day.  Limit alcohol intake to no more than 1 drink a day for nonpregnant women and 2 drinks a day for men. One drink equals 12 oz of beer, 5 oz of wine, or 1 oz of hard liquor.  Work with your health care provider to maintain a healthy body weight or to lose weight. Ask what an ideal weight is for you.  Get at least 30 minutes of exercise that causes your heart to beat faster (aerobic exercise) most days of the week. Activities may include walking, swimming, or biking.  Work with your health care provider or diet and nutrition specialist (dietitian) to adjust your eating plan to your individual calorie needs. Reading food labels  Check food labels for the amount of sodium per serving. Choose foods with less than 5 percent of the Daily Value of sodium. Generally, foods with less than 300 mg of sodium per serving fit into this eating plan.  To find whole grains, look for the word "whole" as the first word in the ingredient list. Shopping  Buy products labeled as "low-sodium" or "no salt added."  Buy fresh foods. Avoid canned foods and premade or frozen meals. Cooking  Avoid adding salt when cooking. Use salt-free seasonings or herbs instead of table salt or sea salt. Check with your health care provider or pharmacist before using salt substitutes.  Do not fry foods. Cook foods using healthy methods such as baking, boiling, grilling, and  broiling instead.  Cook with heart-healthy oils, such as olive, canola, soybean, or sunflower oil. Meal planning   Eat a balanced diet that includes: ? 5 or more servings of fruits and vegetables each day. At each meal, try to fill half of your plate with fruits and vegetables. ? Up to 6-8 servings of whole grains each day. ? Less than 6 oz of lean meat, poultry, or fish each day. A 3-oz serving of meat is about the same size as a deck of cards. One egg equals 1 oz. ? 2 servings of low-fat dairy each day. ? A serving of nuts, seeds, or beans 5 times each week. ? Heart-healthy fats. Healthy fats called Omega-3 fatty acids are found in foods such as flaxseeds and coldwater fish, like sardines, salmon, and mackerel.  Limit how much you eat of the following: ? Canned or prepackaged foods. ? Food that is high in trans fat, such as fried foods. ? Food that is high in saturated fat, such as fatty meat. ? Sweets, desserts, sugary drinks, and other foods with added sugar. ? Full-fat dairy products.  Do not salt foods before eating.  Try to eat at least 2 vegetarian meals each week.  Eat more home-cooked food and less restaurant, buffet, and fast food.  When eating at a restaurant, ask that your food be prepared with less salt or no salt, if possible. What foods are recommended? The items listed may not be a complete list.  Talk with your dietitian about what dietary choices are best for you. Grains Whole-grain or whole-wheat bread. Whole-grain or whole-wheat pasta. Brown rice. [name]. Bulgur. Whole-grain and low-sodium cereals. Pita bread. Low-fat, low-sodium crackers. Whole-wheat flour tortillas. Vegetables Fresh or frozen vegetables (raw, steamed, roasted, or grilled). Low-sodium or reduced-sodium tomato and vegetable juice. Low-sodium or reduced-sodium tomato sauce and tomato paste. Low-sodium or reduced-sodium canned vegetables. Fruits All fresh, dried, or frozen fruit. Canned  fruit in natural juice (without added sugar). Meat and other protein foods Skinless chicken or [place]. Ground chicken or [place]. Pork with fat trimmed off. Fish and seafood. Egg whites. Dried beans, peas, or lentils. Unsalted nuts, nut butters, and seeds. Unsalted canned beans. Lean cuts of beef with fat trimmed off. Low-sodium, lean deli meat. Dairy Low-fat (1%) or fat-free (skim) milk. Fat-free, low-fat, or reduced-fat cheeses. Nonfat, low-sodium ricotta or cottage cheese. Low-fat or nonfat yogurt. Low-fat, low-sodium cheese. Fats and oils Soft margarine without trans fats. Vegetable oil. Low-fat, reduced-fat, or light mayonnaise and salad dressings (reduced-sodium). Canola, safflower, olive, soybean, and sunflower oils. Avocado. Seasoning and other foods Herbs. Spices. Seasoning mixes without salt. Unsalted popcorn and pretzels. Fat-free sweets. What foods are not recommended? The items listed may not be a complete list. Talk with your dietitian about what dietary choices are best for you. Grains Baked goods made with fat, such as croissants, muffins, or some breads. Dry pasta or rice meal packs. Vegetables Creamed or fried vegetables. Vegetables in a cheese sauce. Regular canned vegetables (not low-sodium or reduced-sodium). Regular canned tomato sauce and paste (not low-sodium or reduced-sodium). Regular tomato and vegetable juice (not low-sodium or reduced-sodium). [name]. Olives. Fruits Canned fruit in a light or heavy syrup. Fried fruit. Fruit in cream or butter sauce. Meat and other protein foods Fatty cuts of meat. Ribs. Fried meat. [name]. Sausage. Bologna and other processed lunch meats. Salami. Fatback. Hotdogs. Bratwurst. Salted nuts and seeds. Canned beans with added salt. Canned or smoked fish. Whole eggs or egg yolks. Chicken or [place] with skin. Dairy Whole or 2% milk, cream, and half-and-half. Whole or full-fat cream cheese. Whole-fat or sweetened yogurt. Full-fat cheese.  Nondairy creamers. Whipped toppings. Processed cheese and cheese spreads. Fats and oils Butter. Stick margarine. Lard. Shortening. Ghee. Bacon fat. Tropical oils, such as coconut, palm kernel, or palm oil. Seasoning and other foods Salted popcorn and pretzels. Onion salt, garlic salt, seasoned salt, table salt, and sea salt. Worcestershire sauce. Tartar sauce. Barbecue sauce. Teriyaki sauce. Soy sauce, including reduced-sodium. Steak sauce. Canned and packaged gravies. Fish sauce. Oyster sauce. Cocktail sauce. Horseradish that you find on the shelf. Ketchup. Mustard. Meat flavorings and tenderizers. Bouillon cubes. Hot sauce and Tabasco sauce. Premade or packaged marinades. Premade or packaged taco seasonings. Relishes. Regular salad dressings. Where to find more information:  National Heart, Lung, and [place]: https://wilson-eaton.com/  American Heart Association: www.heart.org Summary  The DASH eating plan is a healthy eating plan that has been shown to reduce high blood pressure (hypertension). It may also reduce your risk for type 2 diabetes, heart disease, and stroke.  With the DASH eating plan, you should limit salt (sodium) intake to 2,300 mg a day. If you have hypertension, you may need to reduce your sodium intake to 1,500 mg a day.  When on the DASH eating plan, aim to eat more fresh fruits and vegetables, whole grains, lean proteins, low-fat dairy, and heart-healthy fats.  Work with your health care provider or diet and nutrition specialist (dietitian) to adjust  your eating plan to your individual calorie needs. This information is not intended to replace advice given to you by your health care provider. Make sure you discuss any questions you have with your health care provider. Document Released: 03/01/2011 Document Revised: 03/05/2016 Document Reviewed: 03/05/2016 Elsevier Interactive Patient Education  2017 [employer].

## 2017-03-13 LAB — COMPLETE METABOLIC PANEL WITH GFR
AG Ratio: 1.4 (calc) (ref 1.0–2.5)
ALT: 14 U/L (ref 6–29)
AST: 16 U/L (ref 10–30)
Albumin: 4.2 g/dL (ref 3.6–5.1)
Alkaline phosphatase (APISO): 63 U/L (ref 33–115)
BUN/Creatinine Ratio: 14 (calc) (ref 6–22)
BUN: 15 mg/dL (ref 7–25)
CO2: 26 mmol/L (ref 20–32)
Calcium: 10.1 mg/dL (ref 8.6–10.2)
Chloride: 106 mmol/L (ref 98–110)
Creat: 1.11 mg/dL — ABNORMAL HIGH (ref 0.50–1.10)
GFR, Est African American: 70 mL/min/{1.73_m2} (ref >=60)
GFR, Est Non African American: 60 mL/min/{1.73_m2} (ref >=60)
Globulin: 2.9 g/dL (calc) (ref 1.9–3.7)
Glucose, Bld: 83 mg/dL (ref 65–139)
Potassium: 4 mmol/L (ref 3.5–5.3)
Sodium: 139 mmol/L (ref 135–146)
Total Bilirubin: 1.5 mg/dL — ABNORMAL HIGH (ref 0.2–1.2)
Total Protein: 7.1 g/dL (ref 6.1–8.1)

## 2017-03-13 LAB — CBC WITH DIFFERENTIAL/PLATELET
Basophils Absolute: 40 cells/uL (ref 0–200)
Basophils Relative: 0.6 %
Eosinophils Absolute: 20 cells/uL (ref 15–500)
Eosinophils Relative: 0.3 %
HCT: 37 % (ref 35.0–45.0)
Hemoglobin: 12.7 g/dL (ref 11.7–15.5)
Lymphs Abs: 2977 cells/uL (ref 850–3900)
MCH: 36.4 pg — ABNORMAL HIGH (ref 27.0–33.0)
MCHC: 34.3 g/dL (ref 32.0–36.0)
MCV: 106 fL — ABNORMAL HIGH (ref 80.0–100.0)
MPV: 10.9 fL (ref 7.5–12.5)
Monocytes Relative: 8.8 %
Neutro Abs: 2983 cells/uL (ref 1500–7800)
Neutrophils Relative %: 45.2 %
Platelets: 247 10*3/uL (ref 140–400)
RBC: 3.49 10*6/uL — ABNORMAL LOW (ref 3.80–5.10)
RDW: 10.9 % — ABNORMAL LOW (ref 11.0–15.0)
Total Lymphocyte: 45.1 %
WBC mixed population: 581 cells/uL (ref 200–950)
WBC: 6.6 10*3/uL (ref 3.8–10.8)

## 2017-03-13 LAB — HEMOGLOBIN A1C
Hgb A1c MFr Bld: 5.2 % of total Hgb (ref <5.7)
Mean Plasma Glucose: 103 (calc)
eAG (mmol/L): 5.7 (calc)

## 2017-03-15 ENCOUNTER — Other Ambulatory Visit: Payer: Self-pay | Admitting: Nurse Practitioner

## 2017-03-15 ENCOUNTER — Other Ambulatory Visit: Payer: Self-pay

## 2017-03-15 DIAGNOSIS — R17 Unspecified jaundice: Secondary | ICD-10-CM

## 2017-03-25 ENCOUNTER — Other Ambulatory Visit: Payer: Self-pay | Admitting: Nurse Practitioner

## 2017-03-25 DIAGNOSIS — I10 Essential (primary) hypertension: Secondary | ICD-10-CM

## 2017-03-25 DIAGNOSIS — R000 Tachycardia, unspecified: Secondary | ICD-10-CM

## 2017-04-01 DIAGNOSIS — Z3042 Encounter for surveillance of injectable contraceptive: Secondary | ICD-10-CM | POA: Diagnosis not present

## 2017-04-22 ENCOUNTER — Other Ambulatory Visit: Payer: Self-pay | Admitting: *Deleted

## 2017-04-22 DIAGNOSIS — R000 Tachycardia, unspecified: Secondary | ICD-10-CM

## 2017-04-22 DIAGNOSIS — I10 Essential (primary) hypertension: Secondary | ICD-10-CM

## 2017-04-22 MED ORDER — METOPROLOL TARTRATE 50 MG PO TABS: 50.0000 mg | ORAL_TABLET | Freq: Two times a day (BID) | ORAL | 1 refills | Status: DC

## 2017-04-22 NOTE — Telephone Encounter (Signed)
[facility] 

## 2017-04-29 ENCOUNTER — Encounter: Payer: Self-pay | Admitting: Nurse Practitioner

## 2017-04-29 ENCOUNTER — Encounter: Payer: Medicare HMO | Admitting: Nurse Practitioner

## 2017-04-29 ENCOUNTER — Ambulatory Visit (INDEPENDENT_AMBULATORY_CARE_PROVIDER_SITE_OTHER): Payer: Medicare HMO | Admitting: Nurse Practitioner

## 2017-04-29 VITALS — BP 126/84 | HR 85 | Temp 98.6°F | Ht 64.0 in | Wt 168.0 lb

## 2017-04-29 DIAGNOSIS — R17 Unspecified jaundice: Secondary | ICD-10-CM | POA: Diagnosis not present

## 2017-04-29 DIAGNOSIS — Z0000 Encounter for general adult medical examination without abnormal findings: Secondary | ICD-10-CM

## 2017-04-29 DIAGNOSIS — I10 Essential (primary) hypertension: Secondary | ICD-10-CM | POA: Diagnosis not present

## 2017-04-29 DIAGNOSIS — H6123 Impacted cerumen, bilateral: Secondary | ICD-10-CM

## 2017-04-29 LAB — BILIRUBIN, DIRECT: Bilirubin, Direct: 0.4 mg/dL — ABNORMAL HIGH (ref 0.0–0.2)

## 2017-04-29 MED ORDER — TETANUS-DIPHTH-ACELL PERTUSSIS 5-2.5-18.5 LF-MCG/0.5 IM SUSP: 0.5000 mL | Freq: Once | INTRAMUSCULAR | 0 refills | Status: AC

## 2017-04-29 MED ORDER — TETANUS-DIPHTH-ACELL PERTUSSIS 5-2.5-18.5 LF-MCG/0.5 IM SUSP: 0.5000 mL | Freq: Once | INTRAMUSCULAR | 0 refills | Status: DC

## 2017-04-29 NOTE — Progress Notes (Signed)
Careteam: Patient Care Team: Lauree Chandler, NP as PCP - General (Geriatric Medicine)  Advanced Directive information    No Known Allergies  Chief Complaint  Patient presents with  . Annual Exam    fill out forms for [employer]     HPI: Patient is a 47 y.o. female seen in the office today for annual exam and form for special [employer].  She wants to do track and field and soccer. Has not done any activity at this time.  Previously did track and field in [place].  Overall doing well. Has gotten back into working out at the gym without problems, no exercise intolerance.  Going to GYN for regular exams and depo-provera injections.   TDAP not up to date.   Heart rate and blood pressure under good control with lopressor.  No chest pains, palpitations noted.    Review of Systems:  Review of Systems  Constitutional: Negative for chills, fever and weight loss.  HENT: Negative for tinnitus.   Respiratory: Negative for cough, sputum production and shortness of breath.   Cardiovascular: Negative for chest pain, palpitations and leg swelling.  Gastrointestinal: Negative for abdominal pain, constipation, diarrhea and heartburn.  Genitourinary: Negative for dysuria, frequency and urgency.  Musculoskeletal: Negative for back pain, falls, joint pain and myalgias.  Skin: Negative.  Negative for itching and rash.  Neurological: Negative for dizziness and headaches.  Psychiatric/Behavioral: Negative for depression and memory loss. The patient does not have insomnia.     Past Medical History:  Diagnosis Date  . Hyperlipidemia   . Overactive bladder   . Recurrent UTI    Past Surgical History:  Procedure Laterality Date  . uretral stent     Social History:   reports that  has never smoked. she has never used smokeless tobacco. She reports that she does not drink alcohol or use drugs.  Family History  Problem Relation Age of Onset  . Hypertension Mother   . Arthritis  Mother   . Cancer Father 65       stomach    Medications: Patient's Medications  New Prescriptions   No medications on file  Previous Medications   CHOLECALCIFEROL (D3 SUPER STRENGTH) 2000 UNITS CAPS    Take 2,000 Units by mouth daily.   MEDROXYPROGESTERONE (DEPO-PROVERA) 150 MG/ML INJECTION    Inject 1 mL (150 mg total) into the muscle every 3 (three) months.   METOPROLOL TARTRATE (LOPRESSOR) 50 MG TABLET    Take 1 tablet (50 mg total) by mouth 2 (two) times daily.   MULTIVITAMIN-IRON-MINERALS-FOLIC ACID (CENTRUM) CHEWABLE TABLET    Chew 2 tablets by mouth daily.   PRAVASTATIN (PRAVACHOL) 40 MG TABLET    Take 1 tablet (40 mg total) by mouth daily.  Modified Medications   No medications on file  Discontinued Medications   No medications on file     Physical Exam:  Vitals:   04/29/17 0858  BP: 126/84  Pulse: 85  Temp: 98.6 F (37 C)  SpO2: 98%  Weight: 168 lb (76.2 kg)  Height: 5\' 4"  (1.626 m)   Body mass index is 28.84 kg/m.  Physical Exam  Constitutional: She is oriented to person, place, and time. She appears well-developed and well-nourished. No distress.  HENT:  Head: Normocephalic and atraumatic.  Right Ear: External ear normal.  Left Ear: External ear normal.  Nose: Nose normal.  Mouth/Throat: Oropharynx is clear and moist. No oropharyngeal exudate.  Cerumen impaction bilaterally, without loss of hearing  Eyes:  Conjunctivae and EOM are normal. Pupils are equal, round, and reactive to light.  Neck: Normal range of motion. Neck supple.  Cardiovascular: Normal rate and regular rhythm.  No murmur heard. Pulmonary/Chest: Effort normal and breath sounds normal. No respiratory distress.  Abdominal: Soft. Bowel sounds are normal.  Musculoskeletal: Normal range of motion. She exhibits no edema or tenderness.  Neurological: She is alert and oriented to person, place, and time. She displays normal reflexes. No cranial nerve deficit or sensory deficit. She exhibits  normal muscle tone. Coordination normal.  Skin: Skin is warm and dry.  Psychiatric: She has a normal mood and affect.    Labs reviewed: Basic Metabolic Panel: Recent Labs    05/25/16 0856 10/02/16 0836 03/12/17 1531  NA 140 141 139  K 4.0 4.0 4.0  CL 108 109 106  CO2 23 21 26   GLUCOSE 104* 117* 83  BUN 11 14 15   CREATININE 1.05 1.17* 1.11*  CALCIUM 9.4 9.7 10.1   Liver Function Tests: Recent Labs    04/30/16 0818 10/02/16 0836 03/12/17 1531  AST 13 13 16   ALT 13 10 14   ALKPHOS 60 68  --   BILITOT 1.6* 1.6* 1.5*  PROT 7.0 6.9 7.1  ALBUMIN 4.1 4.1  --    No results for input(s): LIPASE, AMYLASE in the last 8760 hours. No results for input(s): AMMONIA in the last 8760 hours. CBC: Recent Labs    04/30/16 0820 03/12/17 1531  WBC 5.3 6.6  NEUTROABS 3,127 2,983  HGB 12.7 12.7  HCT 38.3 37.0  MCV 107.3* 106.0*  PLT 274 247   Lipid Panel: Recent Labs    10/02/16 0836  CHOL 140  HDL 47*  LDLCALC 84  TRIG 43  CHOLHDL 3.0   TSH: No results for input(s): TSH in the last 8760 hours. A1C: Lab Results  Component Value Date   HGBA1C 5.2 03/12/2017     Assessment/Plan 1. Essential hypertension -well controlled on current regimen - EKG 12-Lead-NSR rate 83 -cont metoprolol   2. Wellness examination -doing well. Form for special Olympics -she does not drink ETOH or smoke cigarettes, education was given reguarding dental and periodontal disease, diet, regular sustained exercise for at least 30 minutes 5 times per week, routine screening interval for mammogram as recommended by the [place] and ACOG, importance of regular PAP smears  and recommended schedule for GI hemoccult testing, colonoscopy, cholesterol, thyroid and diabetes screening.  3. Elevated bilirubin - Bilirubin, direct  4. Bilateral impacted cerumen Due to time does not wish to have ear lavage, will follow up at next OV  Next appt: 09/06/2017 as scheduled Howie Rufus K. Harle Battiest  [facility] [phone] 8 am - 5 pm) [phone] (after hours)

## 2017-04-29 NOTE — Patient Instructions (Signed)
Keep follow up appts June as scheduled.

## 2017-06-21 DIAGNOSIS — Z3042 Encounter for surveillance of injectable contraceptive: Secondary | ICD-10-CM | POA: Diagnosis not present

## 2017-07-03 DIAGNOSIS — H52223 Regular astigmatism, bilateral: Secondary | ICD-10-CM | POA: Diagnosis not present

## 2017-09-06 ENCOUNTER — Other Ambulatory Visit: Payer: Medicare HMO

## 2017-09-06 DIAGNOSIS — R17 Unspecified jaundice: Secondary | ICD-10-CM

## 2017-09-06 LAB — BILIRUBIN, DIRECT: Bilirubin, Direct: 0.3 mg/dL — ABNORMAL HIGH (ref 0.0–0.2)

## 2017-09-10 ENCOUNTER — Ambulatory Visit: Payer: Medicare HMO | Admitting: Nurse Practitioner

## 2017-09-10 ENCOUNTER — Ambulatory Visit: Payer: Self-pay

## 2017-09-11 DIAGNOSIS — Z3042 Encounter for surveillance of injectable contraceptive: Secondary | ICD-10-CM | POA: Diagnosis not present

## 2017-10-18 ENCOUNTER — Other Ambulatory Visit: Payer: Self-pay | Admitting: Nurse Practitioner

## 2017-11-20 ENCOUNTER — Other Ambulatory Visit: Payer: Self-pay | Admitting: Nurse Practitioner

## 2017-11-20 DIAGNOSIS — I10 Essential (primary) hypertension: Secondary | ICD-10-CM

## 2017-11-20 DIAGNOSIS — R000 Tachycardia, unspecified: Secondary | ICD-10-CM

## 2017-12-09 DIAGNOSIS — Z6830 Body mass index (BMI) 30.0-30.9, adult: Secondary | ICD-10-CM | POA: Diagnosis not present

## 2017-12-09 DIAGNOSIS — Z01419 Encounter for gynecological examination (general) (routine) without abnormal findings: Secondary | ICD-10-CM | POA: Diagnosis not present

## 2017-12-09 DIAGNOSIS — Z1231 Encounter for screening mammogram for malignant neoplasm of breast: Secondary | ICD-10-CM | POA: Diagnosis not present

## 2017-12-09 DIAGNOSIS — Z3042 Encounter for surveillance of injectable contraceptive: Secondary | ICD-10-CM | POA: Diagnosis not present

## 2017-12-24 ENCOUNTER — Telehealth: Payer: Self-pay | Admitting: *Deleted

## 2017-12-24 MED ORDER — LORATADINE 10 MG PO TABS: 10.0000 mg | ORAL_TABLET | Freq: Every day | ORAL | 3 refills | Status: DC

## 2017-12-24 NOTE — Telephone Encounter (Signed)
Patient mother, Christine Terrell called requesting a refill on patient's Claritin. Medication is not in medication list. she stated that patient takes it everyday. Doesn't want to buy OTC states she gets Rx. Mother stated that the pharmacy has been trying to get it filled but it keeps getting denied. I reviewed chart but don't see request. I reviewed appointments and 2 appointments and a lab have been canceled with NO upcoming appointment scheduled. Mother stated that she hasn't canceled any appointments and stated that she is just going to start looking for another provider and hung up.   Is it ok to add Claritin and call to pharmacy. Please advise.

## 2017-12-24 NOTE — Telephone Encounter (Signed)
Mother notified. Rx faxed to pharmacy.

## 2017-12-24 NOTE — Telephone Encounter (Signed)
Yes okay to add Claritin 10 mg daily, also if we could try to set up a follow up since she has not had one recently. Thank you

## 2017-12-26 ENCOUNTER — Other Ambulatory Visit: Payer: Self-pay

## 2017-12-26 DIAGNOSIS — D509 Iron deficiency anemia, unspecified: Secondary | ICD-10-CM

## 2017-12-26 DIAGNOSIS — R739 Hyperglycemia, unspecified: Secondary | ICD-10-CM

## 2017-12-26 DIAGNOSIS — I10 Essential (primary) hypertension: Secondary | ICD-10-CM

## 2017-12-27 ENCOUNTER — Other Ambulatory Visit: Payer: Medicare HMO

## 2017-12-27 DIAGNOSIS — R739 Hyperglycemia, unspecified: Secondary | ICD-10-CM

## 2017-12-27 DIAGNOSIS — D509 Iron deficiency anemia, unspecified: Secondary | ICD-10-CM | POA: Diagnosis not present

## 2017-12-27 DIAGNOSIS — I10 Essential (primary) hypertension: Secondary | ICD-10-CM

## 2017-12-27 DIAGNOSIS — R7309 Other abnormal glucose: Secondary | ICD-10-CM | POA: Diagnosis not present

## 2018-01-01 ENCOUNTER — Encounter: Payer: Self-pay | Admitting: Nurse Practitioner

## 2018-01-01 ENCOUNTER — Ambulatory Visit (INDEPENDENT_AMBULATORY_CARE_PROVIDER_SITE_OTHER): Payer: Medicare HMO | Admitting: Nurse Practitioner

## 2018-01-01 VITALS — BP 132/70 | HR 88 | Temp 98.9°F | Ht 64.0 in | Wt 172.2 lb

## 2018-01-01 DIAGNOSIS — I10 Essential (primary) hypertension: Secondary | ICD-10-CM | POA: Diagnosis not present

## 2018-01-01 DIAGNOSIS — H6122 Impacted cerumen, left ear: Secondary | ICD-10-CM | POA: Diagnosis not present

## 2018-01-01 DIAGNOSIS — Z23 Encounter for immunization: Secondary | ICD-10-CM

## 2018-01-01 DIAGNOSIS — R17 Unspecified jaundice: Secondary | ICD-10-CM

## 2018-01-01 DIAGNOSIS — R739 Hyperglycemia, unspecified: Secondary | ICD-10-CM | POA: Diagnosis not present

## 2018-01-01 DIAGNOSIS — E785 Hyperlipidemia, unspecified: Secondary | ICD-10-CM

## 2018-01-01 NOTE — Patient Instructions (Addendum)
Continue to work on dietary modifications and exercise recommended 30 mins    DASH Eating Plan DASH stands for "Dietary Approaches to Stop Hypertension." The DASH eating plan is a healthy eating plan that has been shown to reduce high blood pressure (hypertension). It may also reduce your risk for type 2 diabetes, heart disease, and stroke. The DASH eating plan may also help with weight loss. What are tips for following this plan? General guidelines  Avoid eating more than 2,300 mg (milligrams) of salt (sodium) a day. If you have hypertension, you may need to reduce your sodium intake to 1,500 mg a day.  Limit alcohol intake to no more than 1 drink a day for nonpregnant women and 2 drinks a day for men. One drink equals 12 oz of beer, 5 oz of wine, or 1 oz of hard liquor.  Work with your health care provider to maintain a healthy body weight or to lose weight. Ask what an ideal weight is for you.  Get at least 30 minutes of exercise that causes your heart to beat faster (aerobic exercise) most days of the week. Activities may include walking, swimming, or biking.  Work with your health care provider or diet and nutrition specialist (dietitian) to adjust your eating plan to your individual calorie needs. Reading food labels  Check food labels for the amount of sodium per serving. Choose foods with less than 5 percent of the Daily Value of sodium. Generally, foods with less than 300 mg of sodium per serving fit into this eating plan.  To find whole grains, look for the word "whole" as the first word in the ingredient list. Shopping  Buy products labeled as "low-sodium" or "no salt added."  Buy fresh foods. Avoid canned foods and premade or frozen meals. Cooking  Avoid adding salt when cooking. Use salt-free seasonings or herbs instead of table salt or sea salt. Check with your health care provider or pharmacist before using salt substitutes.  Do not fry foods. Cook foods using healthy  methods such as baking, boiling, grilling, and broiling instead.  Cook with heart-healthy oils, such as olive, canola, soybean, or sunflower oil. Meal planning   Eat a balanced diet that includes: ? 5 or more servings of fruits and vegetables each day. At each meal, try to fill half of your plate with fruits and vegetables. ? Up to 6-8 servings of whole grains each day. ? Less than 6 oz of lean meat, poultry, or fish each day. A 3-oz serving of meat is about the same size as a deck of cards. One egg equals 1 oz. ? 2 servings of low-fat dairy each day. ? A serving of nuts, seeds, or beans 5 times each week. ? Heart-healthy fats. Healthy fats called Omega-3 fatty acids are found in foods such as flaxseeds and coldwater fish, like sardines, salmon, and mackerel.  Limit how much you eat of the following: ? Canned or prepackaged foods. ? Food that is high in trans fat, such as fried foods. ? Food that is high in saturated fat, such as fatty meat. ? Sweets, desserts, sugary drinks, and other foods with added sugar. ? Full-fat dairy products.  Do not salt foods before eating.  Try to eat at least 2 vegetarian meals each week.  Eat more home-cooked food and less restaurant, buffet, and fast food.  When eating at a restaurant, ask that your food be prepared with less salt or no salt, if possible. What foods are recommended? The  items listed may not be a complete list. Talk with your dietitian about what dietary choices are best for you. Grains Whole-grain or whole-wheat bread. Whole-grain or whole-wheat pasta. Brown rice. [name]. Bulgur. Whole-grain and low-sodium cereals. Pita bread. Low-fat, low-sodium crackers. Whole-wheat flour tortillas. Vegetables Fresh or frozen vegetables (raw, steamed, roasted, or grilled). Low-sodium or reduced-sodium tomato and vegetable juice. Low-sodium or reduced-sodium tomato sauce and tomato paste. Low-sodium or reduced-sodium canned  vegetables. Fruits All fresh, dried, or frozen fruit. Canned fruit in natural juice (without added sugar). Meat and other protein foods Skinless chicken or [place]. Ground chicken or [place]. Pork with fat trimmed off. Fish and seafood. Egg whites. Dried beans, peas, or lentils. Unsalted nuts, nut butters, and seeds. Unsalted canned beans. Lean cuts of beef with fat trimmed off. Low-sodium, lean deli meat. Dairy Low-fat (1%) or fat-free (skim) milk. Fat-free, low-fat, or reduced-fat cheeses. Nonfat, low-sodium ricotta or cottage cheese. Low-fat or nonfat yogurt. Low-fat, low-sodium cheese. Fats and oils Soft margarine without trans fats. Vegetable oil. Low-fat, reduced-fat, or light mayonnaise and salad dressings (reduced-sodium). Canola, safflower, olive, soybean, and sunflower oils. Avocado. Seasoning and other foods Herbs. Spices. Seasoning mixes without salt. Unsalted popcorn and pretzels. Fat-free sweets. What foods are not recommended? The items listed may not be a complete list. Talk with your dietitian about what dietary choices are best for you. Grains Baked goods made with fat, such as croissants, muffins, or some breads. Dry pasta or rice meal packs. Vegetables Creamed or fried vegetables. Vegetables in a cheese sauce. Regular canned vegetables (not low-sodium or reduced-sodium). Regular canned tomato sauce and paste (not low-sodium or reduced-sodium). Regular tomato and vegetable juice (not low-sodium or reduced-sodium). [name]. Olives. Fruits Canned fruit in a light or heavy syrup. Fried fruit. Fruit in cream or butter sauce. Meat and other protein foods Fatty cuts of meat. Ribs. Fried meat. [name]. Sausage. Bologna and other processed lunch meats. Salami. Fatback. Hotdogs. Bratwurst. Salted nuts and seeds. Canned beans with added salt. Canned or smoked fish. Whole eggs or egg yolks. Chicken or [place] with skin. Dairy Whole or 2% milk, cream, and half-and-half. Whole or full-fat  cream cheese. Whole-fat or sweetened yogurt. Full-fat cheese. Nondairy creamers. Whipped toppings. Processed cheese and cheese spreads. Fats and oils Butter. Stick margarine. Lard. Shortening. Ghee. Bacon fat. Tropical oils, such as coconut, palm kernel, or palm oil. Seasoning and other foods Salted popcorn and pretzels. Onion salt, garlic salt, seasoned salt, table salt, and sea salt. Worcestershire sauce. Tartar sauce. Barbecue sauce. Teriyaki sauce. Soy sauce, including reduced-sodium. Steak sauce. Canned and packaged gravies. Fish sauce. Oyster sauce. Cocktail sauce. Horseradish that you find on the shelf. Ketchup. Mustard. Meat flavorings and tenderizers. Bouillon cubes. Hot sauce and Tabasco sauce. Premade or packaged marinades. Premade or packaged taco seasonings. Relishes. Regular salad dressings. Where to find more information:  National Heart, Lung, and [place]: https://wilson-eaton.com/  American Heart Association: www.heart.org Summary  The DASH eating plan is a healthy eating plan that has been shown to reduce high blood pressure (hypertension). It may also reduce your risk for type 2 diabetes, heart disease, and stroke.  With the DASH eating plan, you should limit salt (sodium) intake to 2,300 mg a day. If you have hypertension, you may need to reduce your sodium intake to 1,500 mg a day.  When on the DASH eating plan, aim to eat more fresh fruits and vegetables, whole grains, lean proteins, low-fat dairy, and heart-healthy fats.  Work with your health care provider  or diet and nutrition specialist (dietitian) to adjust your eating plan to your individual calorie needs. This information is not intended to replace advice given to you by your health care provider. Make sure you discuss any questions you have with your health care provider. Document Released: 03/01/2011 Document Revised: 03/05/2016 Document Reviewed: 03/05/2016 Elsevier Interactive Patient Education  [employer].

## 2018-01-01 NOTE — Progress Notes (Signed)
Careteam: Patient Care Team: Lauree Chandler, NP as PCP - General (Geriatric Medicine)  Advanced Directive information    No Known Allergies  Chief Complaint  Patient presents with  . Medical Management of Chronic Issues    Patient here today with her Mother Belenda Cruise for 8 month to discuss labs. She would like a flu shot today.     HPI: Patient is a 47 y.o. female seen in the office today for routine follow up.  Continues to follow with GYN for PAP and contraceptive.   HTN- controlled on metoprolol   Hyperlipidemia- on pravastatin, LDL at goal 81  Elevated bilirubin- remains stable.   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  HENT: Negative for tinnitus.   Respiratory: Negative for cough, sputum production and shortness of breath.   Cardiovascular: Negative for chest pain, palpitations and leg swelling.  Gastrointestinal: Negative for abdominal pain, constipation, diarrhea and heartburn.  Genitourinary: Negative for dysuria, frequency and urgency.  Musculoskeletal: Negative for back pain, falls, joint pain and myalgias.  Skin: Negative.  Negative for itching and rash.  Neurological: Negative for dizziness and headaches.  Psychiatric/Behavioral: Negative for depression and memory loss. The patient does not have insomnia.     Past Medical History:  Diagnosis Date  . Hyperlipidemia   . Overactive bladder   . Recurrent UTI    Past Surgical History:  Procedure Laterality Date  . uretral stent     Social History:   reports that she has never smoked. She has never used smokeless tobacco. She reports that she does not drink alcohol or use drugs.  Family History  Problem Relation Age of Onset  . Hypertension Mother   . Arthritis Mother   . Cancer Father 81       stomach     Medications: Patient's Medications  New Prescriptions   No medications on file  Previous Medications   CHOLECALCIFEROL (D3 SUPER STRENGTH) 2000 UNITS CAPS    Take 2,000 Units by mouth daily.   LORATADINE (CLARITIN) 10 MG TABLET    Take 1 tablet (10 mg total) by mouth daily.   MEDROXYPROGESTERONE (DEPO-PROVERA) 150 MG/ML INJECTION    Inject 1 mL (150 mg total) into the muscle every 3 (three) months.   METOPROLOL TARTRATE (LOPRESSOR) 50 MG TABLET    TAKE 1 TABLET BY MOUTH TWICE A DAY   MULTIVITAMIN-IRON-MINERALS-FOLIC ACID (CENTRUM) CHEWABLE TABLET    Chew 2 tablets by mouth daily.   PRAVASTATIN (PRAVACHOL) 40 MG TABLET    TAKE 1 TABLET BY MOUTH EVERY DAY  Modified Medications   No medications on file  Discontinued Medications   No medications on file     Physical Exam:  Vitals:   01/01/18 0841  BP: 132/70  Pulse: 88  Temp: 98.9 F (37.2 C)  SpO2: 99%  Weight: 172 lb 3.2 oz (78.1 kg)  Height: 5\' 4"  (1.626 m)   Body mass index is 29.56 kg/m.  Physical Exam  Constitutional: She is oriented to person, place, and time. She appears well-developed and well-nourished. No distress.  HENT:  Head: Normocephalic and atraumatic.  Right Ear: External ear normal.  Left Ear: External ear normal.  Nose: Nose normal.  Mouth/Throat: Oropharynx is clear and moist. No oropharyngeal exudate.  Cerumen impaction on the left  Eyes: Pupils are equal, round, and reactive to light. Conjunctivae and EOM are normal.  Neck: Normal range of motion. Neck supple.  Cardiovascular: Normal rate and regular rhythm.  No murmur heard. Pulmonary/Chest: Effort normal and breath sounds normal. No respiratory distress.  Abdominal: Soft. Bowel sounds are normal.  Musculoskeletal: Normal range of motion. She exhibits no edema or tenderness.  Neurological: She is alert and oriented to person, place, and time. She displays normal reflexes. No cranial nerve deficit or sensory deficit. She exhibits normal muscle  tone. Coordination normal.  Skin: Skin is warm and dry.  Psychiatric: She has a normal mood and affect.    Labs reviewed: Basic Metabolic Panel: Recent Labs    03/12/17 1531 12/27/17 0845  NA 139 139  K 4.0 3.8  CL 106 105  CO2 26 26  GLUCOSE 83 107*  BUN 15 10  CREATININE 1.11* 1.09  CALCIUM 10.1 9.6   Liver Function Tests: Recent Labs    03/12/17 1531 12/27/17 0845  AST 16 12  ALT 14 10  BILITOT 1.5* 1.9*  PROT 7.1 6.7   No results for input(s): LIPASE, AMYLASE in the last 8760 hours. No results for input(s): AMMONIA in the last 8760 hours. CBC: Recent Labs    03/12/17 1531 12/27/17 0845  WBC 6.6 4.4  NEUTROABS 2,983 2,336  HGB 12.7 12.2  HCT 37.0 36.1  MCV 106.0* 107.8*  PLT 247 219   Lipid Panel: Recent Labs    12/27/17 0845  CHOL 138  HDL 44*  LDLCALC 81  TRIG 46  CHOLHDL 3.1   TSH: No results for input(s): TSH in the last 8760 hours. A1C: Lab Results  Component Value Date   HGBA1C 5.2 03/12/2017     Assessment/Plan 1. Essential hypertension -stable, will continue metoprolol, encouraged dietary modifications as well as exercise.   2. Hyperglycemia -will add A1c, encouraged to follow dietary modifications, low sugar diet.   3. Elevated bilirubin Stable on recent labs  4. Hyperlipidemia, unspecified hyperlipidemia type Stable, encouraged lifestyle modifications with statin.   5. Impacted cerumen, left ear Flushed, pt tolerated well.   Next appt: 6 months with labs prior to visit.  Carlos American. [place], [facility] [phone]

## 2018-01-01 NOTE — Addendum Note (Signed)
Addended by: Charlyne Petrin on: 01/01/2018 09:31 AM   Modules accepted: Orders

## 2018-01-02 LAB — CBC WITH DIFFERENTIAL/PLATELET
Basophils Absolute: 22 cells/uL (ref 0–200)
Basophils Relative: 0.5 %
Eosinophils Absolute: 9 cells/uL — ABNORMAL LOW (ref 15–500)
Eosinophils Relative: 0.2 %
HCT: 36.1 % (ref 35.0–45.0)
Hemoglobin: 12.2 g/dL (ref 11.7–15.5)
Lymphs Abs: 1624 cells/uL (ref 850–3900)
MCH: 36.4 pg — ABNORMAL HIGH (ref 27.0–33.0)
MCHC: 33.8 g/dL (ref 32.0–36.0)
MCV: 107.8 fL — ABNORMAL HIGH (ref 80.0–100.0)
MPV: 11.2 fL (ref 7.5–12.5)
Monocytes Relative: 9.3 %
Neutro Abs: 2336 cells/uL (ref 1500–7800)
Neutrophils Relative %: 53.1 %
Platelets: 219 10*3/uL (ref 140–400)
RBC: 3.35 10*6/uL — ABNORMAL LOW (ref 3.80–5.10)
RDW: 11.3 % (ref 11.0–15.0)
Total Lymphocyte: 36.9 %
WBC mixed population: 409 cells/uL (ref 200–950)
WBC: 4.4 10*3/uL (ref 3.8–10.8)

## 2018-01-02 LAB — LIPID PANEL
Cholesterol: 138 mg/dL (ref <200)
HDL: 44 mg/dL — ABNORMAL LOW (ref >50)
LDL Cholesterol (Calc): 81 mg/dL (calc)
Non-HDL Cholesterol (Calc): 94 mg/dL (calc) (ref <130)
Total CHOL/HDL Ratio: 3.1 (calc) (ref <5.0)
Triglycerides: 46 mg/dL (ref <150)

## 2018-01-02 LAB — COMPLETE METABOLIC PANEL WITH GFR
AG Ratio: 1.5 (calc) (ref 1.0–2.5)
ALT: 10 U/L (ref 6–29)
AST: 12 U/L (ref 10–35)
Albumin: 4 g/dL (ref 3.6–5.1)
Alkaline phosphatase (APISO): 56 U/L (ref 33–115)
BUN: 10 mg/dL (ref 7–25)
CO2: 26 mmol/L (ref 20–32)
Calcium: 9.6 mg/dL (ref 8.6–10.2)
Chloride: 105 mmol/L (ref 98–110)
Creat: 1.09 mg/dL (ref 0.50–1.10)
GFR, Est African American: 71 mL/min/{1.73_m2} (ref >=60)
GFR, Est Non African American: 61 mL/min/{1.73_m2} (ref >=60)
Globulin: 2.7 g/dL (calc) (ref 1.9–3.7)
Glucose, Bld: 107 mg/dL — ABNORMAL HIGH (ref 65–99)
Potassium: 3.8 mmol/L (ref 3.5–5.3)
Sodium: 139 mmol/L (ref 135–146)
Total Bilirubin: 1.9 mg/dL — ABNORMAL HIGH (ref 0.2–1.2)
Total Protein: 6.7 g/dL (ref 6.1–8.1)

## 2018-01-02 LAB — HEMOGLOBIN A1C
Hgb A1c MFr Bld: 5 % of total Hgb (ref <5.7)
Mean Plasma Glucose: 97 (calc)
eAG (mmol/L): 5.4 (calc)

## 2018-01-02 LAB — TEST AUTHORIZATION

## 2018-02-10 ENCOUNTER — Encounter: Payer: Self-pay | Admitting: Nurse Practitioner

## 2018-03-04 DIAGNOSIS — Z3042 Encounter for surveillance of injectable contraceptive: Secondary | ICD-10-CM | POA: Diagnosis not present

## 2018-04-14 DIAGNOSIS — L659 Nonscarring hair loss, unspecified: Secondary | ICD-10-CM | POA: Diagnosis not present

## 2018-04-14 DIAGNOSIS — L658 Other specified nonscarring hair loss: Secondary | ICD-10-CM | POA: Diagnosis not present

## 2018-04-18 ENCOUNTER — Telehealth: Payer: Self-pay | Admitting: *Deleted

## 2018-04-18 NOTE — Telephone Encounter (Signed)
Okay sounds good, thank you.

## 2018-04-18 NOTE — Telephone Encounter (Signed)
Patient mother, Barnetta Chapel called and stated that she took patient to see a Dermatologist due to Hair thinning and they did some test. Stated that patient's Iron level came back over 770 and patient IS NOT on Iron supplements so they suggested her to go to a Hematologist. Mother called here wanting [place] to refer her to one. I informed her that we would need to bring her in for an appointment to be evaluated in order to have proper documentation to send to the specialty provider we refer her to. She stated that she DID NOT want to bring her in to be seen and that she was just going to call the Dermatologist back and have them refer her to them. Stated that she would also have them fax [place] the records for Jessica's review.

## 2018-04-28 ENCOUNTER — Telehealth: Payer: Self-pay | Admitting: Hematology

## 2018-04-28 NOTE — Progress Notes (Signed)
HEMATOLOGY/ONCOLOGY CONSULTATION NOTE  Date of Service: 04/29/2018  Patient Care Team: Lauree Chandler, NP as PCP - General (Geriatric Medicine)  CHIEF COMPLAINTS/PURPOSE OF CONSULTATION:  Elevated Ferritin  HISTORY OF PRESENTING ILLNESS:  Christine Terrell is a wonderful 48 y.o. female who has been [facility] to [place] [facility] Garrison [place] [facility] and management of her Elevated Ferritin. She is accompanied today by her mother. The pt reports that she is doing well overall. The pt's mother notes that her daughter has a learning disability, and was diagnosed with an "Intellectual disability."  The pt's mother notes that the pt was seen to be losing her hair over the last year, and the pt established care with a dermatologist, where she last had labs. The pt's mother denies the pt having any medical problems besides HTN and HLD. She receives a depo shot every 3 months, which has successfully addressed her previous menorrhagia. She takes claritin regularly for allergies. The pt was taking one pill of iron replacement every day for 3-5 years, until one year ago when she was stopped on replacement by her PCP. The pt has never received blood transfusions. Denies family history of liver disease, early heart disease, or elevated iron levels.   The pt denies having any pain anywhere and notes that she has good energy levels. She denies any skin rashes.   Most recent lab results (04/14/18) of CBC w/diff is as follows: all values are WNL except for RBC at 3.57, MCV at 109, MCH at 37.3, RDW at 11.1. 04/14/18 Ferritin at 770  On review of systems, pt and mother report hair loss on scalp, good energy levels, stable weight, and denies pain in general, abdominal pains, skin rashes, and any other symptoms.   On PMHx the pt reports HLD, HTN, seasonal allergies, Intellectual disability On Social Hx the pt denies any alcohol use On Family Hx the pt denies liver disease, early heart  disease, and elevated iron levels.   MEDICAL HISTORY:  Past Medical History:  Diagnosis Date  . Hyperlipidemia   . Overactive bladder   . Recurrent UTI     SURGICAL HISTORY: Past Surgical History:  Procedure Laterality Date  . uretral stent      SOCIAL HISTORY: Social History   Socioeconomic History  . Marital status: Single    Spouse name: Not on file  . Number of children: Not on file  . Years of education: Not on file  . Highest education level: Not on file  Occupational History  . Not on file  Social Needs  . Financial resource strain: Not on file  . Food insecurity:    Worry: Not on file    Inability: Not on file  . Transportation needs:    Medical: Not on file    Non-medical: Not on file  Tobacco Use  . Smoking status: Never Smoker  . Smokeless tobacco: Never Used  Substance and Sexual Activity  . Alcohol use: No  . Drug use: No  . Sexual activity: Never  Lifestyle  . Physical activity:    Days per week: Not on file    Minutes per session: Not on file  . Stress: Not on file  Relationships  . Social connections:    Talks on phone: Not on file    Gets together: Not on file    Attends religious service: Not on file    Active member of club or organization: Not on file  Attends meetings of clubs or organizations: Not on file    Relationship status: Not on file  . Intimate partner violence:    Fear of current or ex partner: Not on file    Emotionally abused: Not on file    Physically abused: Not on file    Forced sexual activity: Not on file  Other Topics Concern  . Not on file  Social History Narrative   Diet:   Do you drink/eat things with caffeine?    Marital status: Single                             What year were you married?   Do you live in a house, apartment, assisted living, condo, trailer, etc)? House   Is it one or more stories? No   How many persons live in your home? 2   Do you have any pets in your home? No   Current or past  profession: OTC Psychologist, educational)   Do you exercise?   A little                                                  Type & how often: Walking   Do you have a living will? No   Do you have a DNR Form? No   Do you have a POA/HPOA forms? No         Patient's caretaker reports unprotected sexual intercourse with multiple partners.     FAMILY HISTORY: Family History  Problem Relation Age of Onset  . Hypertension Mother   . Arthritis Mother   . Cancer Father 67       stomach    ALLERGIES:  has No Known Allergies.  MEDICATIONS:  Current Outpatient Medications  Medication Sig Dispense Refill  . Cholecalciferol (D3 SUPER STRENGTH) 2000 UNITS CAPS Take 2,000 Units by mouth daily.    Marland Kitchen loratadine (CLARITIN) 10 MG tablet Take 1 tablet (10 mg total) by mouth daily. 30 tablet 3  . medroxyPROGESTERone (DEPO-PROVERA) 150 MG/ML injection Inject 1 mL (150 mg total) into the muscle every 3 (three) months. 1 mL   . metoprolol tartrate (LOPRESSOR) 50 MG tablet TAKE 1 TABLET BY MOUTH TWICE A DAY 180 tablet 1  . multivitamin-iron-minerals-folic acid (CENTRUM) chewable tablet Chew 2 tablets by mouth daily.    . pravastatin (PRAVACHOL) 40 MG tablet TAKE 1 TABLET BY MOUTH EVERY DAY 90 tablet 1   No current facility-administered medications for this visit.     REVIEW OF SYSTEMS:    10 Point review of Systems was done is negative except as noted above.  PHYSICAL EXAMINATION:  . Vitals:   04/29/18 1037  BP: 127/84  Pulse: 79  Resp: 18  Temp: 98.2 F (36.8 C)  SpO2: 100%   Filed Weights   04/29/18 1037  Weight: 172 lb 4.8 oz (78.2 kg)   .Body mass index is 29.58 kg/m.  GENERAL:alert, in no acute distress and comfortable SKIN: no acute rashes, no significant lesions EYES: conjunctiva are pink and non-injected, sclera anicteric OROPHARYNX: MMM, no exudates, no oropharyngeal erythema or ulceration NECK: supple, no JVD LYMPH:  no palpable lymphadenopathy in the cervical, axillary or  inguinal regions LUNGS: clear to auscultation b/l with normal respiratory effort HEART: regular rate & rhythm ABDOMEN:  normoactive bowel sounds , non tender, not distended. No palpable hepatosplenomegaly.  Extremity: no pedal edema PSYCH: alert & oriented x 3 with fluent speech NEURO: no focal motor/sensory deficits  LABORATORY DATA:  I have reviewed the data as listed  . CBC Latest Ref Rng & Units 12/27/2017 03/12/2017 04/30/2016  WBC 3.8 - 10.8 Thousand/uL 4.4 6.6 5.3  Hemoglobin 11.7 - 15.5 g/dL 12.2 12.7 12.7  Hematocrit 35.0 - 45.0 % 36.1 37.0 38.3  Platelets 140 - 400 Thousand/uL 219 247 274    . CMP Latest Ref Rng & Units 12/27/2017 03/12/2017 10/02/2016  Glucose 65 - 99 mg/dL 107(H) 83 117(H)  BUN 7 - 25 mg/dL 10 15 14   Creatinine 0.50 - 1.10 mg/dL 1.09 1.11(H) 1.17(H)  Sodium 135 - 146 mmol/L 139 139 141  Potassium 3.5 - 5.3 mmol/L 3.8 4.0 4.0  Chloride 98 - 110 mmol/L 105 106 109  CO2 20 - 32 mmol/L 26 26 21   Calcium 8.6 - 10.2 mg/dL 9.6 10.1 9.7  Total Protein 6.1 - 8.1 g/dL 6.7 7.1 6.9  Total Bilirubin 0.2 - 1.2 mg/dL 1.9(H) 1.5(H) 1.6(H)  Alkaline Phos 33 - 115 U/L - - 68  AST 10 - 35 U/L 12 16 13   ALT 6 - 29 U/L 10 14 10    04/14/18 Ferritin and CBC w/diff:      RADIOGRAPHIC STUDIES: I have personally reviewed the radiological images as listed and agreed with the findings in the report. No results found.  ASSESSMENT & PLAN:  48 y.o. female with  1. Elevated Ferritin PLAN -Discussed patient's most recent labs from 04/14/18, Ferritin at 770, RBC at 3.57, HGB at 13.3, MCV at 109, PLT normal at 226k, WBC normal at 6.8k -Pt was taking daily PO Iron replacement for 3-5 years, until one year ago -Continue holding PO Iron replacement -Will order labs today including iron saturation and the genetic testing to rule out hereditary hemochromatosis  -Discussed that I do not suspect that her elevated ferritin is related to her hair loss over the past year, given the  timeline of how long it would take for her ferritin to become this elevated  -Will see the pt back in 3 weeks    Labs today RTC with dr Irene Limbo with labs in 3 weeks   All of the patients questions were answered with apparent satisfaction. The patient knows to call the clinic with any problems, questions or concerns.  The total time spent in the appt was 35 minutes and more than 50% was on counseling and direct patient cares.    Sullivan Lone MD MS AAHIVMS [facility] Hematology/Oncology Physician [facility]  (Office):       [phone] (Work cell):  [phone] (Fax):           [phone]  04/29/2018 11:22 AM  I, Baldwin Jamaica, am acting as a scribe for Dr. Sullivan Lone.   .I have reviewed the above documentation for accuracy and completeness, and I agree with the above. Brunetta Genera MD

## 2018-04-28 NOTE — Telephone Encounter (Signed)
A new hem appt has been scheduled for th ept to see Dr. Irene Limbo on 2/4 at 10am. Pt has been provided with the address and location of the facility.

## 2018-04-29 ENCOUNTER — Inpatient Hospital Stay: Payer: Medicare HMO

## 2018-04-29 ENCOUNTER — Telehealth: Payer: Self-pay | Admitting: Hematology

## 2018-04-29 ENCOUNTER — Inpatient Hospital Stay: Payer: Medicare HMO | Attending: Hematology | Admitting: Hematology

## 2018-04-29 VITALS — BP 127/84 | HR 79 | Temp 98.2°F | Resp 18 | Ht 64.0 in | Wt 172.3 lb

## 2018-04-29 DIAGNOSIS — F79 Unspecified intellectual disabilities: Secondary | ICD-10-CM | POA: Diagnosis not present

## 2018-04-29 DIAGNOSIS — R7989 Other specified abnormal findings of blood chemistry: Secondary | ICD-10-CM

## 2018-04-29 LAB — CBC WITH DIFFERENTIAL/PLATELET
Abs Immature Granulocytes: 0.01 10*3/uL (ref 0.00–0.07)
Basophils Absolute: 0 10*3/uL (ref 0.0–0.1)
Basophils Relative: 1 %
Eosinophils Absolute: 0 10*3/uL (ref 0.0–0.5)
Eosinophils Relative: 0 %
HCT: 38.3 % (ref 36.0–46.0)
Hemoglobin: 13 g/dL (ref 12.0–15.0)
Immature Granulocytes: 0 %
Lymphocytes Relative: 30 %
Lymphs Abs: 2 10*3/uL (ref 0.7–4.0)
MCH: 37 pg — ABNORMAL HIGH (ref 26.0–34.0)
MCHC: 33.9 g/dL (ref 30.0–36.0)
MCV: 109.1 fL — ABNORMAL HIGH (ref 80.0–100.0)
Monocytes Absolute: 0.7 10*3/uL (ref 0.1–1.0)
Monocytes Relative: 10 %
Neutro Abs: 4 10*3/uL (ref 1.7–7.7)
Neutrophils Relative %: 59 %
Platelets: 203 10*3/uL (ref 150–400)
RBC: 3.51 MIL/uL — ABNORMAL LOW (ref 3.87–5.11)
RDW: 11.7 % (ref 11.5–15.5)
WBC: 6.7 10*3/uL (ref 4.0–10.5)
nRBC: 0 % (ref 0.0–0.2)

## 2018-04-29 LAB — CMP (CANCER CENTER ONLY)
ALT: 10 U/L (ref 0–44)
AST: 12 U/L — ABNORMAL LOW (ref 15–41)
Albumin: 4 g/dL (ref 3.5–5.0)
Alkaline Phosphatase: 58 U/L (ref 38–126)
Anion gap: 6 (ref 5–15)
BUN: 12 mg/dL (ref 6–20)
CO2: 28 mmol/L (ref 22–32)
Calcium: 10 mg/dL (ref 8.9–10.3)
Chloride: 108 mmol/L (ref 98–111)
Creatinine: 1.29 mg/dL — ABNORMAL HIGH (ref 0.44–1.00)
GFR, Est AFR Am: 58 mL/min — ABNORMAL LOW (ref >60)
GFR, Estimated: 50 mL/min — ABNORMAL LOW (ref >60)
Glucose, Bld: 86 mg/dL (ref 70–99)
Potassium: 3.7 mmol/L (ref 3.5–5.1)
Sodium: 142 mmol/L (ref 135–145)
Total Bilirubin: 1.9 mg/dL — ABNORMAL HIGH (ref 0.3–1.2)
Total Protein: 7.3 g/dL (ref 6.5–8.1)

## 2018-04-29 LAB — IRON AND TIBC
Iron: 134 ug/dL (ref 41–142)
Saturation Ratios: 52 % (ref 21–57)
TIBC: 256 ug/dL (ref 236–444)
UIBC: 122 ug/dL (ref 120–384)

## 2018-04-29 LAB — FERRITIN: Ferritin: 449 ng/mL — ABNORMAL HIGH (ref 11–307)

## 2018-04-29 NOTE — Telephone Encounter (Signed)
Scheduled appt per 02/04 los. ° °Printed calendar and avs. °

## 2018-04-29 NOTE — Patient Instructions (Signed)
Thank you for choosing [facility] to provide your oncology and hematology care.  To afford each patient quality time with our providers, please arrive 30 minutes before your scheduled appointment time.  If you arrive late for your appointment, you may be asked to reschedule.  We strive to give you quality time with our providers, and arriving late affects you and other patients whose appointments are after yours.    If you are a no show for multiple scheduled visits, you may be dismissed from the clinic at the providers discretion.     Again, thank you for choosing [facility], our hope is that these requests will decrease the amount of time that you wait before being seen by our physicians.  ______________________________________________________________________   Should you have questions after your visit to the [facility], please contact our office at [phone] between the hours of 8:30 and 4:30 p.m.    Voicemails left after 4:30p.m will not be returned until the following business day.     For prescription refill requests, please have your pharmacy contact us directly.  Please also try to allow 48 hours for prescription requests.     Please contact the scheduling department for questions regarding scheduling.  For scheduling of procedures such as PET scans, CT scans, MRI, Ultrasound, etc please contact central scheduling at [phone].     Resources For Cancer Patients and Caregivers:    Oncolink.org:  A wonderful resource for patients and healthcare providers for information regarding your disease, ways to tract your treatment, what to expect, etc.      American Cancer Society:  800-227-2345  Can help patients locate various types of support and financial assistance   Cancer Care: 1-800-813-HOPE (4673) Provides financial assistance, online support groups, medication/co-pay assistance.     [place] DSS:  336-641-3447 Where to apply  for food stamps, Medicaid, and utility assistance   Medicare Rights Center: 800-333-4114 Helps people with Medicare understand their rights and benefits, navigate the Medicare system, and secure the quality healthcare they deserve   SCAT: 336-333-6589 [place] [employer]'s shared-ride transportation service for eligible riders who have a disability that prevents them from riding the fixed route bus.     For additional information on assistance programs please contact our social worker:   Abigail Elmore:  [phone]  

## 2018-05-01 ENCOUNTER — Other Ambulatory Visit: Payer: Self-pay | Admitting: Nurse Practitioner

## 2018-05-01 NOTE — Telephone Encounter (Signed)
Caregiver called and pharmacy requested Rx.

## 2018-05-02 LAB — HEMOCHROMATOSIS DNA-PCR(C282Y,H63D)

## 2018-05-20 DIAGNOSIS — L658 Other specified nonscarring hair loss: Secondary | ICD-10-CM | POA: Diagnosis not present

## 2018-05-20 NOTE — Progress Notes (Signed)
HEMATOLOGY/ONCOLOGY CONSULTATION NOTE  Date of Service: 05/21/2018  Patient Care Team: Lauree Chandler, NP as PCP - General (Geriatric Medicine)  CHIEF COMPLAINTS/PURPOSE OF CONSULTATION:  Elevated Ferritin  HISTORY OF PRESENTING ILLNESS:  Christine Terrell is a wonderful 48 y.o. female who has been referred to [place] by [facility]. Garrison Columbus [facility] for [facility] and management of her Elevated Ferritin. She is accompanied today by her mother. The pt reports that she is doing well overall. The pt's mother notes that her daughter has a learning disability, and was diagnosed with an "Intellectual disability."  The pt's mother notes that the pt was seen to be losing her hair over the last year, and the pt established care with a dermatologist, where she last had labs. The pt's mother denies the pt having any medical problems besides HTN and HLD. She receives a depo shot every 3 months, which has successfully addressed her previous menorrhagia. She takes claritin regularly for allergies. The pt was taking one pill of iron replacement every day for 3-5 years, until one year ago when she was stopped on replacement by her PCP. The pt has never received blood transfusions. Denies family history of liver disease, early heart disease, or elevated iron levels.   The pt denies having any pain anywhere and notes that she has good energy levels. She denies any skin rashes.   Most recent lab results (04/14/18) of CBC w/diff is as follows: all values are WNL except for RBC at 3.57, MCV at 109, MCH at 37.3, RDW at 11.1. 04/14/18 Ferritin at 770  On review of systems, pt and mother report hair loss on scalp, good energy levels, stable weight, and denies pain in general, abdominal pains, skin rashes, and any other symptoms.   On PMHx the pt reports HLD, HTN, seasonal allergies, Intellectual disability On Social Hx the pt denies any alcohol use On Family Hx the pt denies liver disease, early heart  disease, and elevated iron levels.  Interval History:   Christine Terrell returns today for management and evaluation of her elevated ferritin. The patient's last visit with [place] was on 04/29/18. She is accompanied today by her mother. The pt reports that she is doing well overall.   The pt's mother reports that they have continued follow up with dermatology for treatment of some hair loss. The pt last took iron pills 1-2 years go. She is taking medication for her high blood pressure, hyperlipidemia, and Vitamin D.  The pt endorses good energy levels and denies developing any new concerns. She denies having any abdominal pains or leg swelling.  Lab results (04/29/18) of CBC w/diff and CMP is as follows: all values are WNL except for RBC at 3.51, MCV at 109.1, MCH at 37.0, Creatinine at 1.29, AST at 12, Total Bilirubin at 1.9, GFR at 58. 04/29/18 Ferritin at 449 04/29/18 Iron & TIBC revealed all values WNL  On review of systems, pt reports good energy levels, and denies abdominal pains, leg swelling, and any other symptoms.  MEDICAL HISTORY:  Past Medical History:  Diagnosis Date  . Hyperlipidemia   . Overactive bladder   . Recurrent UTI     SURGICAL HISTORY: Past Surgical History:  Procedure Laterality Date  . uretral stent      SOCIAL HISTORY: Social History   Socioeconomic History  . Marital status: Single    Spouse name: Not on file  . Number of children: Not on file  . Years of education:  Not on file  . Highest education level: Not on file  Occupational History  . Not on file  Social Needs  . Financial resource strain: Not on file  . Food insecurity:    Worry: Not on file    Inability: Not on file  . Transportation needs:    Medical: Not on file    Non-medical: Not on file  Tobacco Use  . Smoking status: Never Smoker  . Smokeless tobacco: Never Used  Substance and Sexual Activity  . Alcohol use: No  . Drug use: No  . Sexual activity: Never  Lifestyle  . Physical  activity:    Days per week: Not on file    Minutes per session: Not on file  . Stress: Not on file  Relationships  . Social connections:    Talks on phone: Not on file    Gets together: Not on file    Attends religious service: Not on file    Active member of club or organization: Not on file    Attends meetings of clubs or organizations: Not on file    Relationship status: Not on file  . Intimate partner violence:    Fear of current or ex partner: Not on file    Emotionally abused: Not on file    Physically abused: Not on file    Forced sexual activity: Not on file  Other Topics Concern  . Not on file  Social History Narrative   Diet:   Do you drink/eat things with caffeine?    Marital status: Single                             What year were you married?   Do you live in a house, apartment, assisted living, condo, trailer, etc)? House   Is it one or more stories? No   How many persons live in your home? 2   Do you have any pets in your home? No   Current or past profession: OTC Psychologist, educational)   Do you exercise?   A little                                                  Type & how often: Walking   Do you have a living will? No   Do you have a DNR Form? No   Do you have a POA/HPOA forms? No         Patient's caretaker reports unprotected sexual intercourse with multiple partners.     FAMILY HISTORY: Family History  Problem Relation Age of Onset  . Hypertension Mother   . Arthritis Mother   . Cancer Father 82       stomach    ALLERGIES:  has No Known Allergies.  MEDICATIONS:  Current Outpatient Medications  Medication Sig Dispense Refill  . Cholecalciferol (D3 SUPER STRENGTH) 2000 UNITS CAPS Take 2,000 Units by mouth daily.    Marland Kitchen loratadine (CLARITIN) 10 MG tablet Take 1 tablet (10 mg total) by mouth daily. 30 tablet 3  . medroxyPROGESTERone (DEPO-PROVERA) 150 MG/ML injection Inject 1 mL (150 mg total) into the muscle every 3 (three) months. 1 mL   .  metoprolol tartrate (LOPRESSOR) 50 MG tablet TAKE 1 TABLET BY MOUTH TWICE A DAY 180 tablet 1  .  multivitamin-iron-minerals-folic acid (CENTRUM) chewable tablet Chew 2 tablets by mouth daily.    . pravastatin (PRAVACHOL) 40 MG tablet TAKE 1 TABLET BY MOUTH EVERY DAY 90 tablet 1   No current facility-administered medications for this visit.     REVIEW OF SYSTEMS:    A 10+ POINT REVIEW OF SYSTEMS WAS OBTAINED including neurology, dermatology, psychiatry, cardiac, respiratory, lymph, extremities, GI, GU, Musculoskeletal, constitutional, breasts, reproductive, HEENT.  All pertinent positives are noted in the HPI.  All others are negative.   PHYSICAL EXAMINATION:  . Vitals:   05/21/18 0903  BP: 126/86  Pulse: 72  Resp: 18  Temp: 97.9 F (36.6 C)  SpO2: 100%   Filed Weights   05/21/18 0903  Weight: 174 lb 1.6 oz (79 kg)   .Body mass index is 29.88 kg/m.  GENERAL:alert, in no acute distress and comfortable SKIN: no acute rashes, no significant lesions EYES: conjunctiva are pink and non-injected, sclera anicteric OROPHARYNX: MMM, no exudates, no oropharyngeal erythema or ulceration NECK: supple, no JVD LYMPH:  no palpable lymphadenopathy in the cervical, axillary or inguinal regions LUNGS: clear to auscultation b/l with normal respiratory effort HEART: regular rate & rhythm ABDOMEN:  normoactive bowel sounds , non tender, not distended. No palpable hepatosplenomegaly.  Extremity: no pedal edema PSYCH: alert & oriented x 3 with fluent speech NEURO: no focal motor/sensory deficits   LABORATORY DATA:  I have reviewed the data as listed  . CBC Latest Ref Rng & Units 04/29/2018 12/27/2017 03/12/2017  WBC 4.0 - 10.5 K/uL 6.7 4.4 6.6  Hemoglobin 12.0 - 15.0 g/dL 13.0 12.2 12.7  Hematocrit 36.0 - 46.0 % 38.3 36.1 37.0  Platelets 150 - 400 K/uL 203 219 247    . CMP Latest Ref Rng & Units 04/29/2018 12/27/2017 03/12/2017  Glucose 70 - 99 mg/dL 86 107(H) 83  BUN 6 - 20 mg/dL 12 10 15     Creatinine 0.44 - 1.00 mg/dL 1.29(H) 1.09 1.11(H)  Sodium 135 - 145 mmol/L 142 139 139  Potassium 3.5 - 5.1 mmol/L 3.7 3.8 4.0  Chloride 98 - 111 mmol/L 108 105 106  CO2 22 - 32 mmol/L 28 26 26   Calcium 8.9 - 10.3 mg/dL 10.0 9.6 10.1  Total Protein 6.5 - 8.1 g/dL 7.3 6.7 7.1  Total Bilirubin 0.3 - 1.2 mg/dL 1.9(H) 1.9(H) 1.5(H)  Alkaline Phos 38 - 126 U/L 58 - -  AST 15 - 41 U/L 12(L) 12 16  ALT 0 - 44 U/L 10 10 14    04/29/18 Hemochromatosis Study:     04/14/18 Ferritin and CBC w/diff:      RADIOGRAPHIC STUDIES: I have personally reviewed the radiological images as listed and agreed with the findings in the report. No results found.  ASSESSMENT & PLAN:  48 y.o. female with  1. Elevated Ferritin Patient's labs upon initial presentation from 04/14/18, Ferritin at 770, RBC at 3.57, HGB at 13.3, MCV at 109, PLT normal at 226k, WBC normal at 6.8k  PLAN -Discussed pt labwork from 04/29/18; Ferritin improved to 449. HGB normal at 13.0. MCV at 109.1. WBC normal at 6.7k, PLT normal at 203k. Total Bilirubin slightly elevated at 1.9 but stable, normal transaminases. -Discussed the 04/29/18 Hemochromatosis DNA study which did not identify a mutation -Discussed that macrocytosis with MCV of 109 can be caused by medication effect or thyroid abnormality. Not of functional consequence at this time. No anemia. Earliest labs from 2017 reflect MCV elevated and stable at that time as well- could be genetic. Recommend ruling  out thyroid function etiology with PCP. -Continue to hold PO iron replacement -Recommend taking Vitamin B complex without iron -Pt was taking daily PO Iron replacement for 3-5 years, until one year ago -Discussed that I do not suspect that her elevated ferritin is related to her hair loss over the past year, given the timeline of how long it would take for her ferritin to become this elevated -Will see the pt back as needed   RTC with Dr Irene Limbo as needed   All of the patients  questions were answered with apparent satisfaction. The patient knows to call the clinic with any problems, questions or concerns.  The total time spent in the appt was 20 minutes and more than 50% was on counseling and direct patient cares.    Sullivan Lone MD MS AAHIVMS [facility] Hematology/Oncology Physician [facility]  (Office):       [phone] (Work cell):  [phone] (Fax):           [phone]  05/21/2018 9:55 AM  I, Baldwin Jamaica, am acting as a scribe for Dr. Sullivan Lone.   .I have reviewed the above documentation for accuracy and completeness, and I agree with the above. Brunetta Genera MD

## 2018-05-21 ENCOUNTER — Other Ambulatory Visit: Payer: Self-pay | Admitting: Nurse Practitioner

## 2018-05-21 ENCOUNTER — Other Ambulatory Visit: Payer: Self-pay | Admitting: *Deleted

## 2018-05-21 ENCOUNTER — Telehealth: Payer: Self-pay | Admitting: Hematology

## 2018-05-21 ENCOUNTER — Inpatient Hospital Stay (HOSPITAL_BASED_OUTPATIENT_CLINIC_OR_DEPARTMENT_OTHER): Payer: Medicare HMO | Admitting: Hematology

## 2018-05-21 ENCOUNTER — Inpatient Hospital Stay: Payer: Medicare HMO

## 2018-05-21 VITALS — BP 126/86 | HR 72 | Temp 97.9°F | Resp 18 | Ht 64.0 in | Wt 174.1 lb

## 2018-05-21 DIAGNOSIS — R000 Tachycardia, unspecified: Secondary | ICD-10-CM

## 2018-05-21 DIAGNOSIS — I10 Essential (primary) hypertension: Secondary | ICD-10-CM

## 2018-05-21 DIAGNOSIS — F79 Unspecified intellectual disabilities: Secondary | ICD-10-CM | POA: Diagnosis not present

## 2018-05-21 DIAGNOSIS — R7989 Other specified abnormal findings of blood chemistry: Secondary | ICD-10-CM

## 2018-05-21 MED ORDER — METOPROLOL TARTRATE 50 MG PO TABS: 50.0000 mg | ORAL_TABLET | Freq: Two times a day (BID) | ORAL | 1 refills | Status: DC

## 2018-05-21 MED ORDER — PRAVASTATIN SODIUM 40 MG PO TABS: 40.0000 mg | ORAL_TABLET | Freq: Every day | ORAL | 1 refills | Status: DC

## 2018-05-21 NOTE — Telephone Encounter (Signed)
[facility] 

## 2018-05-21 NOTE — Telephone Encounter (Signed)
Per 2/26 los RTC with Dr Irene Limbo as needed.

## 2018-05-26 DIAGNOSIS — Z3042 Encounter for surveillance of injectable contraceptive: Secondary | ICD-10-CM | POA: Diagnosis not present

## 2018-06-16 ENCOUNTER — Other Ambulatory Visit: Payer: Self-pay | Admitting: Nurse Practitioner

## 2018-06-18 ENCOUNTER — Other Ambulatory Visit: Payer: Self-pay | Admitting: Nurse Practitioner

## 2018-07-02 ENCOUNTER — Other Ambulatory Visit: Payer: Medicare HMO

## 2018-07-03 ENCOUNTER — Ambulatory Visit: Payer: Medicare HMO | Admitting: Nurse Practitioner

## 2018-07-07 ENCOUNTER — Ambulatory Visit: Payer: Medicare HMO | Admitting: Nurse Practitioner

## 2018-07-08 ENCOUNTER — Other Ambulatory Visit: Payer: Self-pay

## 2018-07-08 ENCOUNTER — Ambulatory Visit: Payer: Medicare HMO | Admitting: Nurse Practitioner

## 2018-07-08 ENCOUNTER — Other Ambulatory Visit: Payer: Medicare HMO

## 2018-07-08 DIAGNOSIS — E785 Hyperlipidemia, unspecified: Secondary | ICD-10-CM | POA: Diagnosis not present

## 2018-07-08 DIAGNOSIS — R739 Hyperglycemia, unspecified: Secondary | ICD-10-CM | POA: Diagnosis not present

## 2018-07-09 LAB — COMPLETE METABOLIC PANEL WITH GFR
AG Ratio: 1.4 (calc) (ref 1.0–2.5)
ALT: 9 U/L (ref 6–29)
AST: 11 U/L (ref 10–35)
Albumin: 4 g/dL (ref 3.6–5.1)
Alkaline phosphatase (APISO): 64 U/L (ref 31–125)
BUN/Creatinine Ratio: 23 (calc) — ABNORMAL HIGH (ref 6–22)
BUN: 27 mg/dL — ABNORMAL HIGH (ref 7–25)
CO2: 25 mmol/L (ref 20–32)
Calcium: 9.6 mg/dL (ref 8.6–10.2)
Chloride: 111 mmol/L — ABNORMAL HIGH (ref 98–110)
Creat: 1.18 mg/dL — ABNORMAL HIGH (ref 0.50–1.10)
GFR, Est African American: 65 mL/min/{1.73_m2} (ref >=60)
GFR, Est Non African American: 56 mL/min/{1.73_m2} — ABNORMAL LOW (ref >=60)
Globulin: 2.8 g/dL (calc) (ref 1.9–3.7)
Glucose, Bld: 135 mg/dL — ABNORMAL HIGH (ref 65–99)
Potassium: 4 mmol/L (ref 3.5–5.3)
Sodium: 142 mmol/L (ref 135–146)
Total Bilirubin: 1.2 mg/dL (ref 0.2–1.2)
Total Protein: 6.8 g/dL (ref 6.1–8.1)

## 2018-07-09 LAB — LIPID PANEL
Cholesterol: 153 mg/dL (ref <200)
HDL: 52 mg/dL (ref >=50)
LDL Cholesterol (Calc): 88 mg/dL (calc)
Non-HDL Cholesterol (Calc): 101 mg/dL (calc) (ref <130)
Total CHOL/HDL Ratio: 2.9 (calc) (ref <5.0)
Triglycerides: 43 mg/dL (ref <150)

## 2018-07-09 LAB — HEMOGLOBIN A1C
Hgb A1c MFr Bld: 5.2 % of total Hgb (ref <5.7)
Mean Plasma Glucose: 103 (calc)
eAG (mmol/L): 5.7 (calc)

## 2018-07-20 ENCOUNTER — Encounter: Payer: Self-pay | Admitting: Nurse Practitioner

## 2018-08-04 DIAGNOSIS — L668 Other cicatricial alopecia: Secondary | ICD-10-CM | POA: Diagnosis not present

## 2018-08-11 DIAGNOSIS — Z3042 Encounter for surveillance of injectable contraceptive: Secondary | ICD-10-CM | POA: Diagnosis not present

## 2018-09-23 DIAGNOSIS — L668 Other cicatricial alopecia: Secondary | ICD-10-CM | POA: Diagnosis not present

## 2018-10-03 ENCOUNTER — Encounter: Payer: Self-pay | Admitting: Family

## 2018-10-03 ENCOUNTER — Other Ambulatory Visit: Payer: Self-pay

## 2018-10-03 ENCOUNTER — Ambulatory Visit (INDEPENDENT_AMBULATORY_CARE_PROVIDER_SITE_OTHER): Payer: Medicare HMO | Admitting: Family

## 2018-10-03 VITALS — BP 120/80 | HR 70 | Temp 98.8°F | Ht 64.0 in | Wt 173.0 lb

## 2018-10-03 DIAGNOSIS — J302 Other seasonal allergic rhinitis: Secondary | ICD-10-CM

## 2018-10-03 DIAGNOSIS — E663 Overweight: Secondary | ICD-10-CM | POA: Diagnosis not present

## 2018-10-03 DIAGNOSIS — Z6829 Body mass index (BMI) 29.0-29.9, adult: Secondary | ICD-10-CM | POA: Diagnosis not present

## 2018-10-03 DIAGNOSIS — R739 Hyperglycemia, unspecified: Secondary | ICD-10-CM

## 2018-10-03 DIAGNOSIS — H6123 Impacted cerumen, bilateral: Secondary | ICD-10-CM | POA: Diagnosis not present

## 2018-10-03 DIAGNOSIS — I10 Essential (primary) hypertension: Secondary | ICD-10-CM

## 2018-10-03 DIAGNOSIS — E785 Hyperlipidemia, unspecified: Secondary | ICD-10-CM | POA: Diagnosis not present

## 2018-10-03 MED ORDER — DEBROX 6.5 % OT SOLN: 5.0000 [drp] | Freq: Two times a day (BID) | OTIC | 0 refills | Status: AC

## 2018-10-03 NOTE — Progress Notes (Signed)
Provider: Dinah Ngetich FNP-C   Lauree Chandler, NP  Patient Care Team: Lauree Chandler, NP as PCP - General (Geriatric Medicine)  Extended Emergency Contact Information Primary Emergency Contact: Clark,Catherine Address: [address]          [place], [place] [address] [place]nette Litter of [place] Phone: [phone] Mobile Phone: [phone] Relation: Mother Secondary Emergency Contact: Davina Poke [place] of [place] Mobile Phone: [phone] Relation: Sister  Code Status: Full Code  Goals of care: Advanced Directive information Advanced Directives 09/17/2016  Does Patient Have a Medical Advance Directive? No  Type of Advance Directive -  Copy of [place] in Chart? -  Would patient like information on creating a medical advance directive? -     Chief Complaint  Patient presents with  . Medical Management of Chronic Issues    28mth follow-up    HPI:  Pt is a 48 y.o. female seen today for medical management of chronic diseases.She is here with her mother.she denies any acute issues during visit.she has had no exposure to person sick with COVID-19.she wears mask whenever they go out and practices social distancing.Recent labs reviewed and discussed with patient.she is on Depo-provera injection every month.reports no signs of bleeding or spotting.    Hypertension - checks blood pressure at home but did not bring log recall most b/p readings in the 130's/80's.she has had no headache,dizziness,chest pain or shortness of breath.currently on metoprolol 50 mg tablet twice daily.Not on ASA but on statin.BUN 27 and CR 1.18  (07/08/2018) slightly higher than previous labs.Fluid intake discussed.  Hyperlipidemia -  LDL 88 (07/08/2018).she has not been able to exercise since COVID-19 restrictions not able to go to the Gym.states too hot to walk in the park due to summer heat.on pravastatin 40 mg tablet daily.No complains of myalgia or muscle  weakness.    Hyperglycemia - Glucose 135 ( 07/08/2018) and Hgb A1C 5.2 labs reviewed with patient and mother.low carbohydrate,low saturated fats,high vegetable diet and increasing physical activity/exercise discussed.she reports no signs of hypo/hyperglycemia.   Seasonal allergies - states symptoms controlled on OTC loratadine 10 mg tablet daily.  Past Medical History:  Diagnosis Date  . Hyperlipidemia   . Overactive bladder   . Recurrent UTI    Past Surgical History:  Procedure Laterality Date  . uretral stent      No Known Allergies  Allergies as of 10/03/2018   No Known Allergies     Medication List       Accurate as of October 03, 2018 11:07 AM. If you have any questions, ask your nurse or doctor.        clindamycin 1 % external solution Commonly known as: CLEOCIN T   D3 Super Strength 50 MCG (2000 UT) Caps Generic drug: Cholecalciferol Take 2,000 Units by mouth daily.   Fluocinolone Acetonide Scalp 0.01 % Oil   loratadine 10 MG tablet Commonly known as: CLARITIN TAKE 1 TABLET BY MOUTH EVERY DAY   medroxyPROGESTERone 150 MG/ML injection Commonly known as: DEPO-PROVERA Inject 1 mL (150 mg total) into the muscle every 3 (three) months.   metoprolol tartrate 50 MG tablet Commonly known as: LOPRESSOR Take 1 tablet (50 mg total) by mouth 2 (two) times daily.   multivitamin-iron-minerals-folic acid chewable tablet Chew 2 tablets by mouth daily.   pravastatin 40 MG tablet Commonly known as: PRAVACHOL Take 1 tablet (40 mg total) by mouth daily.       Review of Systems  Constitutional: Negative  for appetite change, chills, fatigue and fever.  HENT: Negative for congestion, rhinorrhea, sinus pressure, sinus pain and sore throat.   Eyes: Positive for visual disturbance. Negative for discharge, redness and itching.       Wears eye glasses has up coming appointment with ophthalmology   Respiratory: Negative for cough, chest tightness, shortness of breath and  wheezing.   Cardiovascular: Negative for chest pain, palpitations and leg swelling.  Gastrointestinal: Negative for abdominal distention, abdominal pain, constipation, diarrhea, nausea and vomiting.  Endocrine: Negative for cold intolerance, heat intolerance, polydipsia, polyphagia and polyuria.  Genitourinary: Negative for difficulty urinating, dysuria, flank pain, frequency and urgency.  Musculoskeletal: Negative for arthralgias and gait problem.  Skin: Negative for color change, pallor, rash and wound.  Neurological: Negative for dizziness, weakness, light-headedness, numbness and headaches.  Hematological: Does not bruise/bleed easily.  Psychiatric/Behavioral: Negative for agitation, behavioral problems and sleep disturbance. The patient is not nervous/anxious.     Immunization History  Administered Date(s) Administered  . Influenza,inj,Quad PF,6+ Mos 03/31/2015, 12/05/2015, 03/12/2017, 01/01/2018  . PPD Test 05/31/2014   Pertinent  Health Maintenance Due  Topic Date Due  . PAP SMEAR-Modifier  06/24/2017  . MAMMOGRAM  08/24/2017  . INFLUENZA VACCINE  10/25/2018   Fall Risk  10/03/2018 01/01/2018 03/12/2017 09/17/2016 05/29/2016  Falls in the past year? 0 No Yes No No  Number falls in past yr: 0 - 1 - -  Injury with Fall? 0 - No - -    Vitals:   10/03/18 1035  BP: 120/80  Pulse: 70  Temp: 98.8 F (37.1 C)  TempSrc: Oral  SpO2: 98%  Weight: 173 lb (78.5 kg)  Height: 5\' 4"  (1.626 m)   Body mass index is 29.7 kg/m. Physical Exam Vitals signs reviewed.  Constitutional:      General: She is not in acute distress.    Appearance: She is overweight. She is not ill-appearing.  HENT:     Head: Normocephalic.     Right Ear: There is impacted cerumen.     Left Ear: There is impacted cerumen.     Ears:     Comments: Bilateral ear cerumen lavaged with warm water large amounts of cerumen removed with instrument on left ear.Tolerated procedure well.     Nose: Nose normal. No  congestion or rhinorrhea.     Mouth/Throat:     Mouth: Mucous membranes are moist.     Pharynx: Oropharynx is clear. No oropharyngeal exudate or posterior oropharyngeal erythema.  Eyes:     General: No scleral icterus.       Right eye: No discharge.        Left eye: No discharge.     Extraocular Movements: Extraocular movements intact.     Conjunctiva/sclera: Conjunctivae normal.     Pupils: Pupils are equal, round, and reactive to light.     Comments: corrective lens in place   Neck:     Musculoskeletal: Normal range of motion. No neck rigidity or muscular tenderness.     Vascular: No carotid bruit.  Cardiovascular:     Rate and Rhythm: Normal rate and regular rhythm.     Pulses: Normal pulses.     Heart sounds: Normal heart sounds. No murmur. No friction rub. No gallop.   Pulmonary:     Effort: Pulmonary effort is normal. No respiratory distress.     Breath sounds: Normal breath sounds. No wheezing, rhonchi or rales.  Chest:     Chest wall: No tenderness.  Abdominal:  General: Bowel sounds are normal. There is no distension.     Palpations: Abdomen is soft. There is no mass.     Tenderness: There is no abdominal tenderness. There is no right CVA tenderness, left CVA tenderness, guarding or rebound.  Musculoskeletal: Normal range of motion.        General: No swelling or tenderness.     Right lower leg: No edema.     Left lower leg: No edema.  Lymphadenopathy:     Cervical: No cervical adenopathy.  Skin:    General: Skin is warm and dry.     Coloration: Skin is not pale.     Findings: No bruising, erythema or rash.  Neurological:     Mental Status: She is alert and oriented to person, place, and time.     Cranial Nerves: No cranial nerve deficit.     Sensory: No sensory deficit.     Motor: No weakness.     Coordination: Coordination normal.     Gait: Gait normal.  Psychiatric:        Mood and Affect: Mood normal.        Behavior: Behavior normal.        Thought  Content: Thought content normal.        Judgment: Judgment normal.    Labs reviewed: Recent Labs    12/27/17 0845 04/29/18 1141 07/08/18 0856  NA 139 142 142  K 3.8 3.7 4.0  CL 105 108 111*  CO2 26 28 25   GLUCOSE 107* 86 135*  BUN 10 12 27*  CREATININE 1.09 1.29* 1.18*  CALCIUM 9.6 10.0 9.6   Recent Labs    12/27/17 0845 04/29/18 1141 07/08/18 0856  AST 12 12* 11  ALT 10 10 9   ALKPHOS  --  58  --   BILITOT 1.9* 1.9* 1.2  PROT 6.7 7.3 6.8  ALBUMIN  --  4.0  --    Recent Labs    12/27/17 0845 04/29/18 1141  WBC 4.4 6.7  NEUTROABS 2,336 4.0  HGB 12.2 13.0  HCT 36.1 38.3  MCV 107.8* 109.1*  PLT 219 203   Lab Results  Component Value Date   TSH 1.08 01/12/2016   Lab Results  Component Value Date   HGBA1C 5.2 07/08/2018   Lab Results  Component Value Date   CHOL 153 07/08/2018   HDL 52 07/08/2018   LDLCALC 88 07/08/2018   TRIG 43 07/08/2018   CHOLHDL 2.9 07/08/2018    Significant Diagnostic Results in last 30 days:  No results found.  Assessment/Plan 1. Essential hypertension B/p at goal.continue on metoprolol 50 mg tablet twice daily and pravastatin 40 mg tablet daily.will consider ASA for cardiovascular event prevent next visit. CBC/diff,future  TSH level future  CMP, future   2. Hyperlipidemia, unspecified hyperlipidemia type LDL at goal with latest labs.continue on pravastatin 40 mg tablet daily.low carbohydrate,low saturated fats,high vegetable diet and increasing physical activity/exercise advised. Lipid panel,future   3. Hyperglycemia Elevated glucose level with normal Hgb A1C.Discussed diet and exercise as above.  Hgb A1C,future   4. Bilateral impacted cerumen Bilateral ears lavaged with warm water and large cerumen removed with instrument on left ear.tolerated procedure well.   5. Seasonal allergic rhinitis, unspecified trigger Continue on loratadine 10 mg tablet daily.   6. Body mass index 29.0-29.9, adult CBC/diff,future  TSH  level future  CMP, future  Hgb A1C,future  7. Overweight Encouraged to continue with exercise. low carbohydrate,low saturated fats,high vegetable diet. Lab work as  above.   Family/ staff Communication: Reviewed plan of care with patient  Labs/tests ordered:   CBC/diff,future  TSH level future  CMP, future  Hgb A1C,future Lipid panel,future   Next appt : 6 months.labs prior to visit.   Time spent with patient 25 minutes >50% time spent counseling; reviewing medical record; tests; labs; and developing future plan of care  Sandrea Hughs, NP

## 2018-10-07 ENCOUNTER — Other Ambulatory Visit: Payer: Self-pay | Admitting: Nurse Practitioner

## 2018-10-21 DIAGNOSIS — L989 Disorder of the skin and subcutaneous tissue, unspecified: Secondary | ICD-10-CM | POA: Diagnosis not present

## 2018-10-21 DIAGNOSIS — D485 Neoplasm of uncertain behavior of skin: Secondary | ICD-10-CM | POA: Diagnosis not present

## 2018-10-27 DIAGNOSIS — Z3042 Encounter for surveillance of injectable contraceptive: Secondary | ICD-10-CM | POA: Diagnosis not present

## 2018-10-29 DIAGNOSIS — L668 Other cicatricial alopecia: Secondary | ICD-10-CM | POA: Diagnosis not present

## 2018-11-12 DIAGNOSIS — H524 Presbyopia: Secondary | ICD-10-CM | POA: Diagnosis not present

## 2018-12-12 DIAGNOSIS — Z01419 Encounter for gynecological examination (general) (routine) without abnormal findings: Secondary | ICD-10-CM | POA: Diagnosis not present

## 2018-12-12 DIAGNOSIS — Z1231 Encounter for screening mammogram for malignant neoplasm of breast: Secondary | ICD-10-CM | POA: Diagnosis not present

## 2018-12-12 DIAGNOSIS — Z124 Encounter for screening for malignant neoplasm of cervix: Secondary | ICD-10-CM | POA: Diagnosis not present

## 2018-12-12 DIAGNOSIS — Z6829 Body mass index (BMI) 29.0-29.9, adult: Secondary | ICD-10-CM | POA: Diagnosis not present

## 2018-12-12 LAB — HM MAMMOGRAPHY

## 2018-12-15 DIAGNOSIS — Z124 Encounter for screening for malignant neoplasm of cervix: Secondary | ICD-10-CM | POA: Diagnosis not present

## 2018-12-15 LAB — HM PAP SMEAR: HM Pap smear: NORMAL

## 2018-12-25 ENCOUNTER — Ambulatory Visit (INDEPENDENT_AMBULATORY_CARE_PROVIDER_SITE_OTHER): Payer: Medicare HMO

## 2018-12-25 ENCOUNTER — Other Ambulatory Visit: Payer: Self-pay

## 2018-12-25 DIAGNOSIS — Z23 Encounter for immunization: Secondary | ICD-10-CM | POA: Diagnosis not present

## 2019-01-09 ENCOUNTER — Other Ambulatory Visit: Payer: Self-pay | Admitting: Nurse Practitioner

## 2019-01-09 DIAGNOSIS — I10 Essential (primary) hypertension: Secondary | ICD-10-CM

## 2019-01-09 DIAGNOSIS — R000 Tachycardia, unspecified: Secondary | ICD-10-CM

## 2019-01-13 ENCOUNTER — Telehealth: Payer: Self-pay | Admitting: *Deleted

## 2019-01-13 NOTE — Telephone Encounter (Signed)
Patient's mother, Barnetta Chapel called requesting refill on patient's Dep Provera Birth Control.   I instructed her to call her OB/GYN office, Dr. Julien Girt for this.   She stated that she has never had to do this before. That [facility] has always filled the medication.  I do not see record of this.   I instructed her again to call patient's OB/GYN to received Rx for Birth Control. She stated that she would call them for refill.

## 2019-01-13 NOTE — Telephone Encounter (Signed)
Yes this is an injection that her GYN gives her.

## 2019-01-19 DIAGNOSIS — Z3042 Encounter for surveillance of injectable contraceptive: Secondary | ICD-10-CM | POA: Diagnosis not present

## 2019-04-02 ENCOUNTER — Other Ambulatory Visit: Payer: Medicare HMO

## 2019-04-06 ENCOUNTER — Ambulatory Visit: Payer: Medicare HMO | Admitting: Family

## 2019-04-07 DIAGNOSIS — Z3042 Encounter for surveillance of injectable contraceptive: Secondary | ICD-10-CM | POA: Diagnosis not present

## 2019-04-24 ENCOUNTER — Ambulatory Visit (INDEPENDENT_AMBULATORY_CARE_PROVIDER_SITE_OTHER): Payer: Medicare HMO | Admitting: Family

## 2019-04-24 ENCOUNTER — Encounter: Payer: Self-pay | Admitting: Family

## 2019-04-24 ENCOUNTER — Other Ambulatory Visit: Payer: Self-pay

## 2019-04-24 VITALS — BP 120/78 | HR 78 | Temp 97.5°F | Resp 18 | Ht 64.0 in | Wt 176.0 lb

## 2019-04-24 DIAGNOSIS — Z114 Encounter for screening for human immunodeficiency virus [HIV]: Secondary | ICD-10-CM | POA: Diagnosis not present

## 2019-04-24 DIAGNOSIS — J302 Other seasonal allergic rhinitis: Secondary | ICD-10-CM

## 2019-04-24 DIAGNOSIS — D509 Iron deficiency anemia, unspecified: Secondary | ICD-10-CM

## 2019-04-24 DIAGNOSIS — Z23 Encounter for immunization: Secondary | ICD-10-CM

## 2019-04-24 DIAGNOSIS — I10 Essential (primary) hypertension: Secondary | ICD-10-CM

## 2019-04-24 DIAGNOSIS — E785 Hyperlipidemia, unspecified: Secondary | ICD-10-CM

## 2019-04-24 MED ORDER — LORATADINE 10 MG PO TABS: 10.0000 mg | ORAL_TABLET | Freq: Every day | ORAL | 1 refills | Status: DC

## 2019-04-24 MED ORDER — TETANUS-DIPHTH-ACELL PERTUSSIS 5-2.5-18.5 LF-MCG/0.5 IM SUSP: 0.5000 mL | Freq: Once | INTRAMUSCULAR | 0 refills | Status: AC

## 2019-04-24 NOTE — Progress Notes (Signed)
Provider: Janeal Abadi FNP-C   Lauree Chandler, NP  Patient Care Team: Lauree Chandler, NP as PCP - General (Geriatric Medicine)  Extended Emergency Contact Information Primary Emergency Contact: Clark,Catherine Address: [address]          [place], [place] [address] Johnnette Litter of [place] Phone: [phone] Mobile Phone: [phone] Relation: Mother Secondary Emergency Contact: Davina Poke [place] of [place] Mobile Phone: [phone] Relation: Sister  Code Status: Full Code  Goals of care: Advanced Directive information Advanced Directives 04/24/2019  Does Patient Have a Medical Advance Directive? No  Type of Advance Directive -  Copy of [place] in Chart? -  Would patient like information on creating a medical advance directive? No - Patient declined     Chief Complaint  Patient presents with  . Medical Management of Chronic Issues    [facility] follow-up    HPI:  Pt is a 49y.o. female seen today for 6 months follow up for medical management of chronic diseases.she denies any acute issues this visit.she is here with her mother.she has lab worker order in previous visit but mother had cancelled appointment and didn't rescheduled.  Hypertension - No home B/P log for evaluation.on Metoprolol tartrate 50 mg tablet twice daily.she denies any symptoms of hypotension.   Hyperlipidemia - on Pravastatin 40 mg tablet daily.[facility] saturated fats and high vegetable diet discussed.  Seasonal Allergies - states symptoms controlled on loratadine 10 mg tablet daily.Need refilled today.   Anemia - previous Hgb 13.0 high abnormal indices.Iron and TIBC normal.Ferritin high 449   Health Maintenance:  - Has had pap smear and her mammogram done at her Gynecologist  12/15/2018.No malignancy reported.  - Due for Tdap had injection over 10 yrs ago.Advised script will be send  today to her pharmacy to get injection at her pharmacy. - Has never been screened for HIV.Reports low risk.Agree to add to future lab work.   Past Medical History:  Diagnosis Date  . Hyperlipidemia   . Overactive bladder   . Recurrent UTI    Past Surgical History:  Procedure Laterality Date  . uretral stent      No Known Allergies  Allergies as of 04/24/2019   No Known Allergies     Medication List       Accurate as of April 24, 2019  8:52 AM. If you have any questions, ask your nurse or doctor.        STOP taking these medications   clindamycin 1 % external solution Commonly known as: CLEOCIN T Stopped by: DSandrea Hughs NP     TAKE these medications   D3 Super Strength 50 MCG (2000 UT) Caps Generic drug: Cholecalciferol Take 2,000 Units by mouth daily.   Fluocinolone Acetonide Scalp 0.01 % Oil   loratadine 10 MG tablet Commonly known as: CLARITIN TAKE 1 TABLET BY MOUTH EVERY DAY   medroxyPROGESTERone 150 MG/ML injection Commonly known as: DEPO-PROVERA Inject 1 mL (150 mg total) into the muscle every 3 (three) months.   metoprolol tartrate 50 MG tablet Commonly known as: LOPRESSOR TAKE 1 TABLET BY MOUTH TWICE A DAY   multivitamin-iron-minerals-folic acid chewable tablet Chew 2 tablets by mouth daily.   pravastatin 40 MG tablet Commonly known as: PRAVACHOL TAKE 1 TABLET BY MOUTH EVERY DAY       Review of Systems  Constitutional: Negative for appetite change, chills, fatigue and fever.  HENT: Negative  for congestion, rhinorrhea, sinus pressure, sinus pain, sneezing, sore throat and trouble swallowing.   Eyes: Positive for visual disturbance. Negative for discharge, redness and itching.       Follows up with Ophthalmology  Respiratory: Negative for cough, chest tightness, shortness of breath and wheezing.   Cardiovascular: Negative for chest pain, palpitations and leg swelling.  Gastrointestinal: Negative for abdominal distention, abdominal pain,  constipation, diarrhea and nausea.  Endocrine: Negative for cold intolerance, heat intolerance, polydipsia, polyphagia and polyuria.  Genitourinary: Negative for decreased urine volume, difficulty urinating, dysuria, flank pain, frequency and urgency.  Musculoskeletal: Negative for arthralgias and gait problem.  Skin: Negative for color change, pallor, rash and wound.  Neurological: Negative for dizziness, speech difficulty, weakness, light-headedness, numbness and headaches.  Hematological: Does not bruise/bleed easily.  Psychiatric/Behavioral: Negative for agitation, confusion and sleep disturbance. The patient is not nervous/anxious.     Immunization History  Administered Date(s) Administered  . Influenza,inj,Quad PF,6+ Mos 03/31/2015, 12/05/2015, 03/12/2017, 01/01/2018, 12/25/2018  . PPD Test 05/31/2014   Pertinent  Health Maintenance Due  Topic Date Due  . PAP SMEAR-Modifier  06/24/2017  . MAMMOGRAM  08/24/2017  . INFLUENZA VACCINE  Completed   Fall Risk  04/24/2019 10/03/2018 01/01/2018 03/12/2017 09/17/2016  Falls in the past year? 0 0 No Yes No  Number falls in past yr: 0 0 - 1 -  Injury with Fall? 0 0 - No -    Vitals:   04/24/19 0834  BP: 130/90  Pulse: 78  Temp: (!) 97.5 F (36.4 C)  TempSrc: Oral  SpO2: 96%  Weight: 176 lb (79.8 kg)  Height: _0  (1.626 m)   Body mass index is 30.21 kg/m. Physical Exam Vitals reviewed.  Constitutional:      General: She is not in acute distress.    Appearance: She is obese. She is not ill-appearing.  HENT:     Head: Normocephalic.     Right Ear: Tympanic membrane, ear canal and external ear normal. There is no impacted cerumen.     Left Ear: Tympanic membrane, ear canal and external ear normal. There is no impacted cerumen.     Nose: Nose normal. No congestion or rhinorrhea.     Mouth/Throat:     Mouth: Mucous membranes are moist.     Pharynx: Oropharynx is clear. No oropharyngeal exudate or posterior oropharyngeal  erythema.  Eyes:     General: No scleral icterus.       Right eye: No discharge.        Left eye: No discharge.     Extraocular Movements: Extraocular movements intact.     Conjunctiva/sclera: Conjunctivae normal.     Pupils: Pupils are equal, round, and reactive to light.  Neck:     Vascular: No carotid bruit.  Cardiovascular:     Rate and Rhythm: Normal rate and regular rhythm.     Pulses: Normal pulses.     Heart sounds: Normal heart sounds. No murmur. No friction rub. No gallop.   Pulmonary:     Effort: Pulmonary effort is normal. No respiratory distress.     Breath sounds: Normal breath sounds. No wheezing, rhonchi or rales.  Chest:     Chest wall: No tenderness.  Abdominal:     General: Bowel sounds are normal. There is no distension.     Palpations: Abdomen is soft. There is no mass.     Tenderness: There is no abdominal tenderness. There is no right CVA tenderness, left CVA tenderness, guarding or  rebound.  Musculoskeletal:        General: No swelling or tenderness. Normal range of motion.     Cervical back: Normal range of motion. No rigidity or tenderness.     Right lower leg: No edema.     Left lower leg: No edema.  Lymphadenopathy:     Cervical: No cervical adenopathy.  Skin:    General: Skin is warm and dry.     Coloration: Skin is not jaundiced or pale.     Findings: No bruising, erythema or rash.  Neurological:     Mental Status: She is alert and oriented to person, place, and time.     Cranial Nerves: No cranial nerve deficit.     Sensory: No sensory deficit.     Motor: No weakness.     Coordination: Coordination normal.     Gait: Gait normal.  Psychiatric:        Mood and Affect: Mood normal.        Behavior: Behavior normal.        Thought Content: Thought content normal.        Judgment: Judgment normal.    Labs reviewed: Recent Labs    04/29/18 1141 07/08/18 0856  NA 142 142  K 3.7 4.0  CL 108 111*  CO2 28 25  GLUCOSE 86 135*  BUN 12 27*    CREATININE 1.29* 1.18*  CALCIUM 10.0 9.6   Recent Labs    04/29/18 1141 07/08/18 0856  AST 12* 11  ALT 10 9  ALKPHOS 58  --   BILITOT 1.9* 1.2  PROT 7.3 6.8  ALBUMIN 4.0  --    Recent Labs    04/29/18 1141  WBC 6.7  NEUTROABS 4.0  HGB 13.0  HCT 38.3  MCV 109.1*  PLT 203   Lab Results  Component Value Date   TSH 1.08 01/12/2016   Lab Results  Component Value Date   HGBA1C 5.2 07/08/2018   Lab Results  Component Value Date   CHOL 153 07/08/2018   HDL 52 07/08/2018   LDLCALC 88 07/08/2018   TRIG 43 07/08/2018   CHOLHDL 2.9 07/08/2018    Significant Diagnostic Results in last 30 days:  No results found.  Assessment/Plan 1. Essential hypertension B/p at goal.continue on Metoprolol tartrate 50 mg tablet twice daily. - DASH diet education information provided on AVS. - CBC with Differential/Platelet; Future - CMP with eGFR([facility]); Future - TSH; Future  2. Hyperlipidemia, unspecified hyperlipidemia type Has active lab work.Advised to book appointment for fasting labs.continue on dietary and lifestyle modification.   - Lipid panel; Future  3. Seasonal allergic rhinitis, unspecified trigger Continue on loratadine 10 mg tablet daily.  - loratadine (CLARITIN) 10 MG tablet; Take 1 tablet (10 mg total) by mouth daily.  Dispense: 90 tablet; Refill: 1  4. Iron deficiency anemia, unspecified iron deficiency anemia type Hgb stable.continue MVI  - CBC with Differential/Platelet; Future  5. Need for Tdap vaccination Advised to get Tdap injection at her Pharmacy mother verbalized understanding.  - Tdap ([place]) 5-2.5-18.5 LF-MCG/0.5 injection; Inject 0.5 mLs into the muscle once for 1 dose.  Dispense: 0.5 mL; Refill: 0  6. Encounter for screening for HIV Low risk.Has never been screened. - HIV antibody (with reflex); Future  Family/ staff Communication: Reviewed plan of care with patient and Mother.   Labs/tests ordered:  - CBC with Differential/Platelet;  Future - CMP with eGFR([facility]); Future - TSH; Future - Lipid panel; Future - HIV antibody (with reflex);  Future  Next Appointment : 6 months for medical management of chronic issues. Labs in 2-4 days prior to visit. Also get current active lab draw in 1-2 weeks or sooner.   Sandrea Hughs, NP

## 2019-04-24 NOTE — Patient Instructions (Signed)
- Schedule appointment for labs 1-2 weeks or sooner    DASH Eating Plan DASH stands for "Dietary Approaches to Stop Hypertension." The DASH eating plan is a healthy eating plan that has been shown to reduce high blood pressure (hypertension). It may also reduce your risk for type 2 diabetes, heart disease, and stroke. The DASH eating plan may also help with weight loss. What are tips for following this plan?  General guidelines  Avoid eating more than 2,300 mg (milligrams) of salt (sodium) a day. If you have hypertension, you may need to reduce your sodium intake to 1,500 mg a day.  Limit alcohol intake to no more than 1 drink a day for nonpregnant women and 2 drinks a day for men. One drink equals 12 oz of beer, 5 oz of wine, or 1 oz of hard liquor.  Work with your health care provider to maintain a healthy body weight or to lose weight. Ask what an ideal weight is for you.  Get at least 30 minutes of exercise that causes your heart to beat faster (aerobic exercise) most days of the week. Activities may include walking, swimming, or biking.  Work with your health care provider or diet and nutrition specialist (dietitian) to adjust your eating plan to your individual calorie needs. Reading food labels   Check food labels for the amount of sodium per serving. Choose foods with less than 5 percent of the Daily Value of sodium. Generally, foods with less than 300 mg of sodium per serving fit into this eating plan.  To find whole grains, look for the word "whole" as the first word in the ingredient list. Shopping  Buy products labeled as "low-sodium" or "no salt added."  Buy fresh foods. Avoid canned foods and premade or frozen meals. Cooking  Avoid adding salt when cooking. Use salt-free seasonings or herbs instead of table salt or sea salt. Check with your health care provider or pharmacist before using salt substitutes.  Do not fry foods. Cook foods using healthy methods such as  baking, boiling, grilling, and broiling instead.  Cook with heart-healthy oils, such as olive, canola, soybean, or sunflower oil. Meal planning  Eat a balanced diet that includes: ? 5 or more servings of fruits and vegetables each day. At each meal, try to fill half of your plate with fruits and vegetables. ? Up to 6-8 servings of whole grains each day. ? Less than 6 oz of lean meat, poultry, or fish each day. A 3-oz serving of meat is about the same size as a deck of cards. One egg equals 1 oz. ? 2 servings of low-fat dairy each day. ? A serving of nuts, seeds, or beans 5 times each week. ? Heart-healthy fats. Healthy fats called Omega-3 fatty acids are found in foods such as flaxseeds and coldwater fish, like sardines, salmon, and mackerel.  Limit how much you eat of the following: ? Canned or prepackaged foods. ? Food that is high in trans fat, such as fried foods. ? Food that is high in saturated fat, such as fatty meat. ? Sweets, desserts, sugary drinks, and other foods with added sugar. ? Full-fat dairy products.  Do not salt foods before eating.  Try to eat at least 2 vegetarian meals each week.  Eat more home-cooked food and less restaurant, buffet, and fast food.  When eating at a restaurant, ask that your food be prepared with less salt or no salt, if possible. What foods are recommended? The items  listed may not be a complete list. Talk with your dietitian about what dietary choices are best for you. Grains Whole-grain or whole-wheat bread. Whole-grain or whole-wheat pasta. Brown rice. [name]. Bulgur. Whole-grain and low-sodium cereals. Pita bread. Low-fat, low-sodium crackers. Whole-wheat flour tortillas. Vegetables Fresh or frozen vegetables (raw, steamed, roasted, or grilled). Low-sodium or reduced-sodium tomato and vegetable juice. Low-sodium or reduced-sodium tomato sauce and tomato paste. Low-sodium or reduced-sodium canned vegetables. Fruits All fresh,  dried, or frozen fruit. Canned fruit in natural juice (without added sugar). Meat and other protein foods Skinless chicken or [place]. Ground chicken or [place]. Pork with fat trimmed off. Fish and seafood. Egg whites. Dried beans, peas, or lentils. Unsalted nuts, nut butters, and seeds. Unsalted canned beans. Lean cuts of beef with fat trimmed off. Low-sodium, lean deli meat. Dairy Low-fat (1%) or fat-free (skim) milk. Fat-free, low-fat, or reduced-fat cheeses. Nonfat, low-sodium ricotta or cottage cheese. Low-fat or nonfat yogurt. Low-fat, low-sodium cheese. Fats and oils Soft margarine without trans fats. Vegetable oil. Low-fat, reduced-fat, or light mayonnaise and salad dressings (reduced-sodium). Canola, safflower, olive, soybean, and sunflower oils. Avocado. Seasoning and other foods Herbs. Spices. Seasoning mixes without salt. Unsalted popcorn and pretzels. Fat-free sweets. What foods are not recommended? The items listed may not be a complete list. Talk with your dietitian about what dietary choices are best for you. Grains Baked goods made with fat, such as croissants, muffins, or some breads. Dry pasta or rice meal packs. Vegetables Creamed or fried vegetables. Vegetables in a cheese sauce. Regular canned vegetables (not low-sodium or reduced-sodium). Regular canned tomato sauce and paste (not low-sodium or reduced-sodium). Regular tomato and vegetable juice (not low-sodium or reduced-sodium). [name]. Olives. Fruits Canned fruit in a light or heavy syrup. Fried fruit. Fruit in cream or butter sauce. Meat and other protein foods Fatty cuts of meat. Ribs. Fried meat. [name]. Sausage. Bologna and other processed lunch meats. Salami. Fatback. Hotdogs. Bratwurst. Salted nuts and seeds. Canned beans with added salt. Canned or smoked fish. Whole eggs or egg yolks. Chicken or [place] with skin. Dairy Whole or 2% milk, cream, and half-and-half. Whole or full-fat cream cheese. Whole-fat or sweetened  yogurt. Full-fat cheese. Nondairy creamers. Whipped toppings. Processed cheese and cheese spreads. Fats and oils Butter. Stick margarine. Lard. Shortening. Ghee. Bacon fat. Tropical oils, such as coconut, palm kernel, or palm oil. Seasoning and other foods Salted popcorn and pretzels. Onion salt, garlic salt, seasoned salt, table salt, and sea salt. Worcestershire sauce. Tartar sauce. Barbecue sauce. Teriyaki sauce. Soy sauce, including reduced-sodium. Steak sauce. Canned and packaged gravies. Fish sauce. Oyster sauce. Cocktail sauce. Horseradish that you find on the shelf. Ketchup. Mustard. Meat flavorings and tenderizers. Bouillon cubes. Hot sauce and Tabasco sauce. Premade or packaged marinades. Premade or packaged taco seasonings. Relishes. Regular salad dressings. Where to find more information:  National Heart, Lung, and [place]: https://wilson-eaton.com/  American Heart Association: www.heart.org Summary  The DASH eating plan is a healthy eating plan that has been shown to reduce high blood pressure (hypertension). It may also reduce your risk for type 2 diabetes, heart disease, and stroke.  With the DASH eating plan, you should limit salt (sodium) intake to 2,300 mg a day. If you have hypertension, you may need to reduce your sodium intake to 1,500 mg a day.  When on the DASH eating plan, aim to eat more fresh fruits and vegetables, whole grains, lean proteins, low-fat dairy, and heart-healthy fats.  Work with your health care provider or  diet and nutrition specialist (dietitian) to adjust your eating plan to your individual calorie needs. This information is not intended to replace advice given to you by your health care provider. Make sure you discuss any questions you have with your health care provider. Document Revised: 02/22/2017 Document Reviewed: 03/05/2016 Elsevier Patient Education  2020 [employer].

## 2019-06-29 DIAGNOSIS — Z3042 Encounter for surveillance of injectable contraceptive: Secondary | ICD-10-CM | POA: Diagnosis not present

## 2019-07-06 ENCOUNTER — Other Ambulatory Visit: Payer: Self-pay | Admitting: Nurse Practitioner

## 2019-08-22 ENCOUNTER — Other Ambulatory Visit: Payer: Self-pay | Admitting: Nurse Practitioner

## 2019-08-25 ENCOUNTER — Telehealth: Payer: Self-pay | Admitting: *Deleted

## 2019-08-25 MED ORDER — PRAVASTATIN SODIUM 40 MG PO TABS: ORAL_TABLET | ORAL | 0 refills | Status: DC

## 2019-08-25 NOTE — Telephone Encounter (Signed)
Patient mother called and left voicemail on Clinical intake stating that patient needed a Rx and pharmacy sent request.   Tried calling back and LMOM to return call.   Patient needs lab appointment scheduled. Has not had the current labs drawn. Last labs drawn was 07/08/2018.  Last OV 04/24/2019: Next Appointment : 6 months for medical management of chronic issues. Labs in 2-4 days prior to visit. Also get current active lab draw in 1-2 weeks or sooner.   Sandrea Hughs, NP

## 2019-08-25 NOTE — Telephone Encounter (Signed)
Appointment scheduled for labs 08/26/2019. Called in Rx due to no medication left and patient scheduled appointment.

## 2019-08-26 ENCOUNTER — Other Ambulatory Visit: Payer: Medicare HMO

## 2019-08-26 ENCOUNTER — Other Ambulatory Visit: Payer: Self-pay

## 2019-08-26 DIAGNOSIS — E785 Hyperlipidemia, unspecified: Secondary | ICD-10-CM | POA: Diagnosis not present

## 2019-08-26 DIAGNOSIS — Z114 Encounter for screening for human immunodeficiency virus [HIV]: Secondary | ICD-10-CM | POA: Diagnosis not present

## 2019-08-26 DIAGNOSIS — I10 Essential (primary) hypertension: Secondary | ICD-10-CM

## 2019-08-27 LAB — CBC WITH DIFFERENTIAL/PLATELET
Absolute Monocytes: 510 cells/uL (ref 200–950)
Basophils Absolute: 20 cells/uL (ref 0–200)
Basophils Relative: 0.4 %
Eosinophils Absolute: 30 cells/uL (ref 15–500)
Eosinophils Relative: 0.6 %
HCT: 37.5 % (ref 35.0–45.0)
Hemoglobin: 12.5 g/dL (ref 11.7–15.5)
Lymphs Abs: 1925 cells/uL (ref 850–3900)
MCH: 36.5 pg — ABNORMAL HIGH (ref 27.0–33.0)
MCHC: 33.3 g/dL (ref 32.0–36.0)
MCV: 109.6 fL — ABNORMAL HIGH (ref 80.0–100.0)
MPV: 11 fL (ref 7.5–12.5)
Monocytes Relative: 10.2 %
Neutro Abs: 2515 cells/uL (ref 1500–7800)
Neutrophils Relative %: 50.3 %
Platelets: 239 10*3/uL (ref 140–400)
RBC: 3.42 10*6/uL — ABNORMAL LOW (ref 3.80–5.10)
RDW: 11.3 % (ref 11.0–15.0)
Total Lymphocyte: 38.5 %
WBC: 5 10*3/uL (ref 3.8–10.8)

## 2019-08-27 LAB — COMPLETE METABOLIC PANEL WITH GFR
AG Ratio: 1.4 (calc) (ref 1.0–2.5)
ALT: 18 U/L (ref 6–29)
AST: 17 U/L (ref 10–35)
Albumin: 3.9 g/dL (ref 3.6–5.1)
Alkaline phosphatase (APISO): 56 U/L (ref 31–125)
BUN/Creatinine Ratio: 14 (calc) (ref 6–22)
BUN: 16 mg/dL (ref 7–25)
CO2: 27 mmol/L (ref 20–32)
Calcium: 9.8 mg/dL (ref 8.6–10.2)
Chloride: 107 mmol/L (ref 98–110)
Creat: 1.18 mg/dL — ABNORMAL HIGH (ref 0.50–1.10)
GFR, Est African American: 64 mL/min/{1.73_m2} (ref >=60)
GFR, Est Non African American: 55 mL/min/{1.73_m2} — ABNORMAL LOW (ref >=60)
Globulin: 2.8 g/dL (calc) (ref 1.9–3.7)
Glucose, Bld: 121 mg/dL — ABNORMAL HIGH (ref 65–99)
Potassium: 4.1 mmol/L (ref 3.5–5.3)
Sodium: 140 mmol/L (ref 135–146)
Total Bilirubin: 1.5 mg/dL — ABNORMAL HIGH (ref 0.2–1.2)
Total Protein: 6.7 g/dL (ref 6.1–8.1)

## 2019-08-27 LAB — LIPID PANEL
Cholesterol: 179 mg/dL (ref <200)
HDL: 51 mg/dL (ref >=50)
LDL Cholesterol (Calc): 114 mg/dL (calc) — ABNORMAL HIGH
Non-HDL Cholesterol (Calc): 128 mg/dL (calc) (ref <130)
Total CHOL/HDL Ratio: 3.5 (calc) (ref <5.0)
Triglycerides: 56 mg/dL (ref <150)

## 2019-08-27 LAB — TSH: TSH: 2.29 mIU/L

## 2019-08-27 LAB — HIV ANTIBODY (ROUTINE TESTING W REFLEX): HIV 1&2 Ab, 4th Generation: NONREACTIVE

## 2019-09-02 ENCOUNTER — Encounter: Payer: Self-pay | Admitting: Family

## 2019-09-02 ENCOUNTER — Ambulatory Visit (INDEPENDENT_AMBULATORY_CARE_PROVIDER_SITE_OTHER): Payer: Medicare HMO | Admitting: Family

## 2019-09-02 ENCOUNTER — Ambulatory Visit: Payer: Self-pay | Admitting: Family

## 2019-09-02 ENCOUNTER — Other Ambulatory Visit: Payer: Self-pay

## 2019-09-02 VITALS — BP 140/92 | HR 86 | Temp 97.1°F | Resp 16 | Ht 64.0 in | Wt 183.8 lb

## 2019-09-02 DIAGNOSIS — J302 Other seasonal allergic rhinitis: Secondary | ICD-10-CM

## 2019-09-02 DIAGNOSIS — Z23 Encounter for immunization: Secondary | ICD-10-CM | POA: Diagnosis not present

## 2019-09-02 DIAGNOSIS — E785 Hyperlipidemia, unspecified: Secondary | ICD-10-CM | POA: Diagnosis not present

## 2019-09-02 DIAGNOSIS — R739 Hyperglycemia, unspecified: Secondary | ICD-10-CM

## 2019-09-02 DIAGNOSIS — I10 Essential (primary) hypertension: Secondary | ICD-10-CM | POA: Diagnosis not present

## 2019-09-02 DIAGNOSIS — D509 Iron deficiency anemia, unspecified: Secondary | ICD-10-CM

## 2019-09-02 MED ORDER — TETANUS-DIPHTH-ACELL PERTUSSIS 5-2.5-18.5 LF-MCG/0.5 IM SUSP: 0.5000 mL | Freq: Once | INTRAMUSCULAR | 0 refills | Status: AC

## 2019-09-02 NOTE — Progress Notes (Signed)
Provider: Marlowe Sax FNP-C   Ishmel Acevedo, Nelda Bucks, NP  Patient Care Team: Arland Usery, Nelda Bucks, NP as PCP - General (Family Medicine)  Extended Emergency Contact Information Primary Emergency Contact: Clark,Catherine Address: [address]          [place], [place] [address] [place] of [place] Phone: [phone] Mobile Phone: [phone] Relation: Mother Secondary Emergency Contact: Davina Poke [place] of [place] Mobile Phone: [phone] Relation: Sister  Code Status:  Full Code  Goals of care: Advanced Directive information Advanced Directives 09/02/2019  Does Patient Have a Medical Advance Directive? No  Type of Advance Directive -  Copy of [place] in Chart? -  Would patient like information on creating a medical advance directive? No - Patient declined     Chief Complaint  Patient presents with  . Medical Management of Chronic Issues    6 Month Follow Up  . Results    Discuss Labs.  . Health Maintenance    Discuss the need for Hepatitis C Screening.  . Immunizations    Discuss the need for Tetanus Vaccine.     HPI:  Pt is a 49 y.o. female seen today for 6 months follow up for medical management of chronic diseases.she is here with her mother.she denies any acute issues this visit.Recent lab results discussed with patient and mother. Hypertension - B/p elevated this visit but states just came from the Gym exercising and did not take her blood pressure medication.Usally does not take medication prior to going for exercise.she rode 6 miles on the bike this morning.she denies any headache,dizziness,changes in vision,chest pain or shortness of breath.CR level slight elevated but stable compared to previous level.discussed avoiding NSAIDs.   Hyperlipidemia - LDL 114 previous 88,chol 179,TRG 56 states likes [place] on salads and likes nuts.she does exercise twice a week with the mother in the [place].On pravastatin 40 mg tablet  daily.sehdenies any muscle weakness or aches.  Due for Tdap vaccine - Tdap ordered in the past but didn't get it at her pharmacy.Discussed getting Tdap at her pharmacy.Advised to get at least 2-4 weeks apart from her COVID-19 vaccine.  Has completed COVID-19 vaccine last does given 08/10/2019.Reports no side effects.  Hyperglycemia - glucose level 121 previous 135   Past Medical History:  Diagnosis Date  . Hyperlipidemia   . Overactive bladder   . Recurrent UTI    Past Surgical History:  Procedure Laterality Date  . uretral stent      No Known Allergies  Allergies as of 09/02/2019   No Known Allergies     Medication List       Accurate as of September 02, 2019 10:06 AM. If you have any questions, ask your nurse or doctor.        D3 Super Strength 50 MCG (2000 UT) Caps Generic drug: Cholecalciferol Take 2,000 Units by mouth daily.   loratadine 10 MG tablet Commonly known as: CLARITIN Take 1 tablet (10 mg total) by mouth daily.   medroxyPROGESTERone 150 MG/ML injection Commonly known as: DEPO-PROVERA Inject 1 mL (150 mg total) into the muscle every 3 (three) months.   metoprolol tartrate 50 MG tablet Commonly known as: LOPRESSOR TAKE 1 TABLET BY MOUTH TWICE A DAY   multivitamin-iron-minerals-folic acid chewable tablet Chew 2 tablets by mouth daily.   pravastatin 40 MG tablet Commonly known as: PRAVACHOL Take one tablet by mouth once daily   Tdap 5-2.5-18.5 LF-MCG/0.5 injection Commonly known as: BOOSTRIX Inject 0.5 mLs into the muscle  once for 1 dose.       Review of Systems  Constitutional: Negative for appetite change, chills, fatigue and fever.  HENT: Positive for rhinorrhea. Negative for congestion, sinus pressure, sinus pain, sneezing, sore throat and trouble swallowing.        Claritin effective   Eyes: Positive for visual disturbance. Negative for discharge, redness and itching.       Wears eyes glasses   Respiratory: Negative for cough, chest tightness,  shortness of breath and wheezing.   Cardiovascular: Negative for chest pain, palpitations and leg swelling.  Gastrointestinal: Negative for abdominal distention, abdominal pain, constipation, diarrhea, nausea and vomiting.  Endocrine: Negative for cold intolerance, heat intolerance, polydipsia, polyphagia and polyuria.  Genitourinary: Negative for decreased urine volume, difficulty urinating, dysuria, flank pain, frequency, hematuria and urgency.  Musculoskeletal: Negative for arthralgias, back pain, gait problem, joint swelling and myalgias.  Skin: Negative for color change, pallor and rash.  Neurological: Negative for dizziness, speech difficulty, weakness, light-headedness, numbness and headaches.  Hematological: Does not bruise/bleed easily.  Psychiatric/Behavioral: Negative for agitation and sleep disturbance. The patient is not nervous/anxious.     Immunization History  Administered Date(s) Administered  . Influenza,inj,Quad PF,6+ Mos 03/31/2015, 12/05/2015, 03/12/2017, 01/01/2018, 12/25/2018  . PFIZER SARS-COV-2 Vaccination 07/18/2019, 08/10/2019  . PPD Test 05/31/2014   Pertinent  Health Maintenance Due  Topic Date Due  . INFLUENZA VACCINE  10/25/2019  . MAMMOGRAM  12/12/2019  . PAP SMEAR-Modifier  12/14/2021   Fall Risk  09/02/2019 04/24/2019 10/03/2018 01/01/2018 03/12/2017  Falls in the past year? 0 0 0 No Yes  Number falls in past yr: 0 0 0 - 1  Injury with Fall? 0 0 0 - No    Vitals:   09/02/19 0838  BP: (!) 140/92  Pulse: 86  Resp: 16  Temp: (!) 97.1 F (36.2 C)  SpO2: 96%  Weight: 183 lb 12.8 oz (83.4 kg)  Height: _0  (1.626 m)   Body mass index is 31.55 kg/m. Physical Exam Vitals reviewed.  Constitutional:      General: She is not in acute distress.    Appearance: She is obese. She is not ill-appearing.  HENT:     Head: Normocephalic.     Right Ear: Tympanic membrane, ear canal and external ear normal. There is no impacted cerumen.     Left Ear:  Tympanic membrane, ear canal and external ear normal. There is no impacted cerumen.     Nose: Nose normal. No congestion or rhinorrhea.     Mouth/Throat:     Mouth: Mucous membranes are moist.     Pharynx: Oropharynx is clear. No oropharyngeal exudate or posterior oropharyngeal erythema.  Eyes:     General: No scleral icterus.       Right eye: No discharge.        Left eye: No discharge.     Extraocular Movements: Extraocular movements intact.     Conjunctiva/sclera: Conjunctivae normal.     Pupils: Pupils are equal, round, and reactive to light.     Comments: Corrective lens in place  Neck:     Vascular: No carotid bruit.  Cardiovascular:     Rate and Rhythm: Normal rate and regular rhythm.     Pulses: Normal pulses.     Heart sounds: Normal heart sounds. No murmur. No friction rub. No gallop.   Pulmonary:     Effort: Pulmonary effort is normal. No respiratory distress.     Breath sounds: Normal breath sounds. No wheezing,  rhonchi or rales.  Chest:     Chest wall: No tenderness.  Abdominal:     General: Bowel sounds are normal. There is no distension.     Palpations: Abdomen is soft. There is no mass.     Tenderness: There is no abdominal tenderness. There is no right CVA tenderness, guarding or rebound.  Musculoskeletal:        General: No swelling or tenderness. Normal range of motion.     Cervical back: Normal range of motion. No rigidity or tenderness.     Right lower leg: No edema.     Left lower leg: No edema.  Lymphadenopathy:     Cervical: No cervical adenopathy.  Skin:    General: Skin is warm.     Coloration: Skin is not pale.     Findings: No bruising, erythema or rash.  Neurological:     Mental Status: She is alert and oriented to person, place, and time.     Cranial Nerves: No cranial nerve deficit.     Sensory: No sensory deficit.     Motor: No weakness.     Coordination: Coordination normal.     Gait: Gait normal.  Psychiatric:        Mood and Affect:  Mood normal.        Behavior: Behavior normal.        Thought Content: Thought content normal.        Judgment: Judgment normal.    Labs reviewed: Recent Labs    08/26/19 0822  NA 140  K 4.1  CL 107  CO2 27  GLUCOSE 121*  BUN 16  CREATININE 1.18*  CALCIUM 9.8   Recent Labs    08/26/19 0822  AST 17  ALT 18  BILITOT 1.5*  PROT 6.7   Recent Labs    08/26/19 0822  WBC 5.0  NEUTROABS 2,515  HGB 12.5  HCT 37.5  MCV 109.6*  PLT 239   Lab Results  Component Value Date   TSH 2.29 08/26/2019   Lab Results  Component Value Date   HGBA1C 5.2 07/08/2018   Lab Results  Component Value Date   CHOL 179 08/26/2019   HDL 51 08/26/2019   LDLCALC 114 (H) 08/26/2019   TRIG 56 08/26/2019   CHOLHDL 3.5 08/26/2019    Significant Diagnostic Results in last 30 days:  No results found.  Assessment/Plan 1. Essential hypertension B/p not at goal this visit though has not take her blood pressure medication.previous B/p was within normal range.  - CBC with Differential/Platelet; Future - CMP with eGFR([facility]); Future - TSH; Future  2. Hyperlipidemia, unspecified hyperlipidemia type LDL not at goal though has improved compared to last visit. discussed dietary changes cutting down on saturated fats using alternative for [place].Also advised eating nuts in moderation and choosing low fat nuts such as almonds instead of cashews,peanuts  and walnuts which tends to be high in fats verbalized understanding.  - continue on pravastatin  - Lipid panel; Future  3. Seasonal allergic rhinitis, unspecified trigger Symptoms controlled on loratadine 10 mg tablet daily.   4. Iron deficiency anemia, unspecified iron deficiency anemia type Hgb normal but slightly lower than previous. Encouraged to increase dark leafy vegetables and fruits in diet. - CBC with Differential/Platelet; Future  5. Need for Tdap vaccination Advised to get Tdap vaccine 2-4 weeks apart from COVD-19 vaccine.Advised  to get Tdap at her pharmacy - Tdap ([facility]) 5-2.5-18.5 LF-MCG/0.5 injection; Inject 0.5 mLs into the muscle once for  1 dose.  Dispense: 0.5 mL; Refill: 0  6. Hyperglycemia Glucose high on recent labs. Advised to continue on Dietary modification and exercise - Hemoglobin A1c; Future   Family/ staff Communication: Reviewed plan of care with patient and mother verbalized understanding  Labs/tests ordered:  - Lipid panel; Future - CBC with Differential/Platelet; Future - CMP with eGFR([facility]); Future - TSH; Future - Hemoglobin A1c; Future Next Appointment : 6  Months for medical management of chronic issues.Fasting labs prior to visit.  Sandrea Hughs, NP

## 2019-09-02 NOTE — Patient Instructions (Signed)
DASH Eating Plan DASH stands for "Dietary Approaches to Stop Hypertension." The DASH eating plan is a healthy eating plan that has been shown to reduce high blood pressure (hypertension). It may also reduce your risk for type 2 diabetes, heart disease, and stroke. The DASH eating plan may also help with weight loss. What are tips for following this plan?  General guidelines  Avoid eating more than 2,300 mg (milligrams) of salt (sodium) a day. If you have hypertension, you may need to reduce your sodium intake to 1,500 mg a day.  Limit alcohol intake to no more than 1 drink a day for nonpregnant women and 2 drinks a day for men. One drink equals 12 oz of beer, 5 oz of wine, or 1 oz of hard liquor.  Work with your health care provider to maintain a healthy body weight or to lose weight. Ask what an ideal weight is for you.  Get at least 30 minutes of exercise that causes your heart to beat faster (aerobic exercise) most days of the week. Activities may include walking, swimming, or biking.  Work with your health care provider or diet and nutrition specialist (dietitian) to adjust your eating plan to your individual calorie needs. Reading food labels   Check food labels for the amount of sodium per serving. Choose foods with less than 5 percent of the Daily Value of sodium. Generally, foods with less than 300 mg of sodium per serving fit into this eating plan.  To find whole grains, look for the word "whole" as the first word in the ingredient list. Shopping  Buy products labeled as "low-sodium" or "no salt added."  Buy fresh foods. Avoid canned foods and premade or frozen meals. Cooking  Avoid adding salt when cooking. Use salt-free seasonings or herbs instead of table salt or sea salt. Check with your health care provider or pharmacist before using salt substitutes.  Do not fry foods. Cook foods using healthy methods such as baking, boiling, grilling, and broiling instead.  Cook with  heart-healthy oils, such as olive, canola, soybean, or sunflower oil. Meal planning  Eat a balanced diet that includes: ? 5 or more servings of fruits and vegetables each day. At each meal, try to fill half of your plate with fruits and vegetables. ? Up to 6-8 servings of whole grains each day. ? Less than 6 oz of lean meat, poultry, or fish each day. A 3-oz serving of meat is about the same size as a deck of cards. One egg equals 1 oz. ? 2 servings of low-fat dairy each day. ? A serving of nuts, seeds, or beans 5 times each week. ? Heart-healthy fats. Healthy fats called Omega-3 fatty acids are found in foods such as flaxseeds and coldwater fish, like sardines, salmon, and mackerel.  Limit how much you eat of the following: ? Canned or prepackaged foods. ? Food that is high in trans fat, such as fried foods. ? Food that is high in saturated fat, such as fatty meat. ? Sweets, desserts, sugary drinks, and other foods with added sugar. ? Full-fat dairy products.  Do not salt foods before eating.  Try to eat at least 2 vegetarian meals each week.  Eat more home-cooked food and less restaurant, buffet, and fast food.  When eating at a restaurant, ask that your food be prepared with less salt or no salt, if possible. What foods are recommended? The items listed may not be a complete list. Talk with your dietitian about   what dietary choices are best for you. Grains Whole-grain or whole-wheat bread. Whole-grain or whole-wheat pasta. Brown rice. Oatmeal. Quinoa. Bulgur. Whole-grain and low-sodium cereals. Pita bread. Low-fat, low-sodium crackers. Whole-wheat flour tortillas. Vegetables Fresh or frozen vegetables (raw, steamed, roasted, or grilled). Low-sodium or reduced-sodium tomato and vegetable juice. Low-sodium or reduced-sodium tomato sauce and tomato paste. Low-sodium or reduced-sodium canned vegetables. Fruits All fresh, dried, or frozen fruit. Canned fruit in natural juice (without  added sugar). Meat and other protein foods Skinless chicken or turkey. Ground chicken or turkey. Pork with fat trimmed off. Fish and seafood. Egg whites. Dried beans, peas, or lentils. Unsalted nuts, nut butters, and seeds. Unsalted canned beans. Lean cuts of beef with fat trimmed off. Low-sodium, lean deli meat. Dairy Low-fat (1%) or fat-free (skim) milk. Fat-free, low-fat, or reduced-fat cheeses. Nonfat, low-sodium ricotta or cottage cheese. Low-fat or nonfat yogurt. Low-fat, low-sodium cheese. Fats and oils Soft margarine without trans fats. Vegetable oil. Low-fat, reduced-fat, or light mayonnaise and salad dressings (reduced-sodium). Canola, safflower, olive, soybean, and sunflower oils. Avocado. Seasoning and other foods Herbs. Spices. Seasoning mixes without salt. Unsalted popcorn and pretzels. Fat-free sweets. What foods are not recommended? The items listed may not be a complete list. Talk with your dietitian about what dietary choices are best for you. Grains Baked goods made with fat, such as croissants, muffins, or some breads. Dry pasta or rice meal packs. Vegetables Creamed or fried vegetables. Vegetables in a cheese sauce. Regular canned vegetables (not low-sodium or reduced-sodium). Regular canned tomato sauce and paste (not low-sodium or reduced-sodium). Regular tomato and vegetable juice (not low-sodium or reduced-sodium). Pickles. Olives. Fruits Canned fruit in a light or heavy syrup. Fried fruit. Fruit in cream or butter sauce. Meat and other protein foods Fatty cuts of meat. Ribs. Fried meat. Bacon. Sausage. Bologna and other processed lunch meats. Salami. Fatback. Hotdogs. Bratwurst. Salted nuts and seeds. Canned beans with added salt. Canned or smoked fish. Whole eggs or egg yolks. Chicken or turkey with skin. Dairy Whole or 2% milk, cream, and half-and-half. Whole or full-fat cream cheese. Whole-fat or sweetened yogurt. Full-fat cheese. Nondairy creamers. Whipped toppings.  Processed cheese and cheese spreads. Fats and oils Butter. Stick margarine. Lard. Shortening. Ghee. Bacon fat. Tropical oils, such as coconut, palm kernel, or palm oil. Seasoning and other foods Salted popcorn and pretzels. Onion salt, garlic salt, seasoned salt, table salt, and sea salt. Worcestershire sauce. Tartar sauce. Barbecue sauce. Teriyaki sauce. Soy sauce, including reduced-sodium. Steak sauce. Canned and packaged gravies. Fish sauce. Oyster sauce. Cocktail sauce. Horseradish that you find on the shelf. Ketchup. Mustard. Meat flavorings and tenderizers. Bouillon cubes. Hot sauce and Tabasco sauce. Premade or packaged marinades. Premade or packaged taco seasonings. Relishes. Regular salad dressings. Where to find more information:  National Heart, Lung, and Blood Institute: www.nhlbi.nih.gov  American Heart Association: www.heart.org Summary  The DASH eating plan is a healthy eating plan that has been shown to reduce high blood pressure (hypertension). It may also reduce your risk for type 2 diabetes, heart disease, and stroke.  With the DASH eating plan, you should limit salt (sodium) intake to 2,300 mg a day. If you have hypertension, you may need to reduce your sodium intake to 1,500 mg a day.  When on the DASH eating plan, aim to eat more fresh fruits and vegetables, whole grains, lean proteins, low-fat dairy, and heart-healthy fats.  Work with your health care provider or diet and nutrition specialist (dietitian) to adjust your eating plan to your   individual calorie needs. This information is not intended to replace advice given to you by your health care provider. Make sure you discuss any questions you have with your health care provider. Document Revised: 02/22/2017 Document Reviewed: 03/05/2016 Elsevier Patient Education  2020 Elsevier Inc.  

## 2019-09-16 ENCOUNTER — Other Ambulatory Visit: Payer: Self-pay | Admitting: Nurse Practitioner

## 2019-09-16 DIAGNOSIS — Z3042 Encounter for surveillance of injectable contraceptive: Secondary | ICD-10-CM | POA: Diagnosis not present

## 2019-09-16 DIAGNOSIS — R000 Tachycardia, unspecified: Secondary | ICD-10-CM

## 2019-09-16 DIAGNOSIS — I10 Essential (primary) hypertension: Secondary | ICD-10-CM

## 2019-09-22 ENCOUNTER — Other Ambulatory Visit: Payer: Self-pay | Admitting: Family

## 2019-10-02 ENCOUNTER — Other Ambulatory Visit: Payer: Self-pay | Admitting: Family

## 2019-10-05 ENCOUNTER — Telehealth: Payer: Self-pay

## 2019-10-05 MED ORDER — ROSUVASTATIN CALCIUM 5 MG PO TABS: 5.0000 mg | ORAL_TABLET | Freq: Every day | ORAL | 0 refills | Status: DC

## 2019-10-05 NOTE — Addendum Note (Signed)
Addended by: Rafael Bihari A on: 10/05/2019 12:14 PM   Modules accepted: Orders

## 2019-10-05 NOTE — Telephone Encounter (Signed)
May fax Crestor 5 mg tablet daily to pharmacy if okay with patient's mother.

## 2019-10-05 NOTE — Telephone Encounter (Signed)
Patient mother notified and agreed. Will continue fish oil and diet and exercise.  Medication list updated.

## 2019-10-05 NOTE — Telephone Encounter (Signed)
-   Discontinue Pravastatin due to hair loss  Start on Crestor 5 mg tablet one by mouth once daily.

## 2019-10-05 NOTE — Telephone Encounter (Addendum)
Patient mother, Barnetta Chapel, called and stated that the Crestor is also a Statin. Stated that the pharmacist told her that the generic for Crestor is labeled a statin and has side effects also of losing hair. Mother doesn't want patient on this. Also stated that in order to prescribe this medication patient's liver functions should be checked.   Mother is requesting to speak with you directly. [phone]  Please Advise.

## 2019-10-05 NOTE — Telephone Encounter (Signed)
If unable to tolerate any statin will continue on fish oil,dietary modification and exercise for now.last months liver enzymes were within normal range.Unable to speak with her since I'm out at the facility.May make an in office appointment if still having any concerns.

## 2019-10-05 NOTE — Telephone Encounter (Signed)
Patient mom "Loma Boston" called and states that medication "Prevastatin 40mg " has been causing hair loss. Patient mom states that she also needs a refill on this medication. However she will hold off on refill if you change medication to something else. Patient mom has stopped giving "Prevastatin" and has been giving Omega Fish Oil for hair loss. Message sent to PCP [place], Nelda Bucks, NP.

## 2019-10-25 ENCOUNTER — Other Ambulatory Visit: Payer: Self-pay | Admitting: Family

## 2019-10-25 DIAGNOSIS — D509 Iron deficiency anemia, unspecified: Secondary | ICD-10-CM

## 2019-12-16 DIAGNOSIS — N920 Excessive and frequent menstruation with regular cycle: Secondary | ICD-10-CM | POA: Diagnosis not present

## 2019-12-16 DIAGNOSIS — Z6831 Body mass index (BMI) 31.0-31.9, adult: Secondary | ICD-10-CM | POA: Diagnosis not present

## 2019-12-16 DIAGNOSIS — Z3040 Encounter for surveillance of contraceptives, unspecified: Secondary | ICD-10-CM | POA: Diagnosis not present

## 2019-12-16 DIAGNOSIS — Z01419 Encounter for gynecological examination (general) (routine) without abnormal findings: Secondary | ICD-10-CM | POA: Diagnosis not present

## 2019-12-26 DIAGNOSIS — H524 Presbyopia: Secondary | ICD-10-CM | POA: Diagnosis not present

## 2020-01-15 DIAGNOSIS — Z1231 Encounter for screening mammogram for malignant neoplasm of breast: Secondary | ICD-10-CM | POA: Diagnosis not present

## 2020-01-19 ENCOUNTER — Telehealth: Payer: Self-pay

## 2020-01-19 NOTE — Telephone Encounter (Signed)
Called left message to call office to schedule AWV.

## 2020-01-26 ENCOUNTER — Other Ambulatory Visit: Payer: Self-pay | Admitting: Nurse Practitioner

## 2020-01-26 DIAGNOSIS — R000 Tachycardia, unspecified: Secondary | ICD-10-CM

## 2020-01-26 DIAGNOSIS — I10 Essential (primary) hypertension: Secondary | ICD-10-CM

## 2020-03-02 ENCOUNTER — Other Ambulatory Visit: Payer: Medicare HMO

## 2020-03-02 ENCOUNTER — Other Ambulatory Visit: Payer: Self-pay

## 2020-03-02 DIAGNOSIS — D509 Iron deficiency anemia, unspecified: Secondary | ICD-10-CM

## 2020-03-02 DIAGNOSIS — Z3042 Encounter for surveillance of injectable contraceptive: Secondary | ICD-10-CM | POA: Diagnosis not present

## 2020-03-02 DIAGNOSIS — R739 Hyperglycemia, unspecified: Secondary | ICD-10-CM | POA: Diagnosis not present

## 2020-03-02 DIAGNOSIS — E785 Hyperlipidemia, unspecified: Secondary | ICD-10-CM

## 2020-03-02 DIAGNOSIS — I10 Essential (primary) hypertension: Secondary | ICD-10-CM

## 2020-03-03 ENCOUNTER — Other Ambulatory Visit: Payer: Medicare HMO

## 2020-03-03 LAB — COMPLETE METABOLIC PANEL WITH GFR
AG Ratio: 1.5 (calc) (ref 1.0–2.5)
ALT: 12 U/L (ref 6–29)
AST: 15 U/L (ref 10–35)
Albumin: 4 g/dL (ref 3.6–5.1)
Alkaline phosphatase (APISO): 64 U/L (ref 31–125)
BUN: 14 mg/dL (ref 7–25)
CO2: 25 mmol/L (ref 20–32)
Calcium: 9.6 mg/dL (ref 8.6–10.2)
Chloride: 109 mmol/L (ref 98–110)
Creat: 1.1 mg/dL (ref 0.50–1.10)
GFR, Est African American: 69 mL/min/{1.73_m2} (ref >=60)
GFR, Est Non African American: 60 mL/min/{1.73_m2} (ref >=60)
Globulin: 2.7 g/dL (calc) (ref 1.9–3.7)
Glucose, Bld: 108 mg/dL — ABNORMAL HIGH (ref 65–99)
Potassium: 4.1 mmol/L (ref 3.5–5.3)
Sodium: 140 mmol/L (ref 135–146)
Total Bilirubin: 1.6 mg/dL — ABNORMAL HIGH (ref 0.2–1.2)
Total Protein: 6.7 g/dL (ref 6.1–8.1)

## 2020-03-03 LAB — CBC WITH DIFFERENTIAL/PLATELET
Absolute Monocytes: 475 cells/uL (ref 200–950)
Basophils Absolute: 20 cells/uL (ref 0–200)
Basophils Relative: 0.4 %
Eosinophils Absolute: 10 cells/uL — ABNORMAL LOW (ref 15–500)
Eosinophils Relative: 0.2 %
HCT: 37.9 % (ref 35.0–45.0)
Hemoglobin: 12.7 g/dL (ref 11.7–15.5)
Lymphs Abs: 2043 cells/uL (ref 850–3900)
MCH: 36.6 pg — ABNORMAL HIGH (ref 27.0–33.0)
MCHC: 33.5 g/dL (ref 32.0–36.0)
MCV: 109.2 fL — ABNORMAL HIGH (ref 80.0–100.0)
MPV: 12 fL (ref 7.5–12.5)
Monocytes Relative: 9.7 %
Neutro Abs: 2352 cells/uL (ref 1500–7800)
Neutrophils Relative %: 48 %
Platelets: 201 10*3/uL (ref 140–400)
RBC: 3.47 10*6/uL — ABNORMAL LOW (ref 3.80–5.10)
RDW: 10.9 % — ABNORMAL LOW (ref 11.0–15.0)
Total Lymphocyte: 41.7 %
WBC: 4.9 10*3/uL (ref 3.8–10.8)

## 2020-03-03 LAB — LIPID PANEL
Cholesterol: 175 mg/dL (ref <200)
HDL: 49 mg/dL — ABNORMAL LOW (ref >=50)
LDL Cholesterol (Calc): 113 mg/dL (calc) — ABNORMAL HIGH
Non-HDL Cholesterol (Calc): 126 mg/dL (calc) (ref <130)
Total CHOL/HDL Ratio: 3.6 (calc) (ref <5.0)
Triglycerides: 43 mg/dL (ref <150)

## 2020-03-03 LAB — HEMOGLOBIN A1C
Hgb A1c MFr Bld: 5 % of total Hgb (ref <5.7)
Mean Plasma Glucose: 97 mg/dL
eAG (mmol/L): 5.4 mmol/L

## 2020-03-03 LAB — TSH: TSH: 1.24 mIU/L

## 2020-03-08 ENCOUNTER — Ambulatory Visit: Payer: Medicare HMO | Admitting: Family

## 2020-03-09 ENCOUNTER — Encounter: Payer: Self-pay | Admitting: Family

## 2020-03-09 ENCOUNTER — Other Ambulatory Visit: Payer: Self-pay

## 2020-03-09 ENCOUNTER — Ambulatory Visit (INDEPENDENT_AMBULATORY_CARE_PROVIDER_SITE_OTHER): Payer: Medicare HMO | Admitting: Family

## 2020-03-09 VITALS — BP 130/84 | HR 84 | Temp 97.5°F | Resp 16 | Ht 64.0 in | Wt 179.8 lb

## 2020-03-09 DIAGNOSIS — Z1159 Encounter for screening for other viral diseases: Secondary | ICD-10-CM | POA: Diagnosis not present

## 2020-03-09 DIAGNOSIS — E538 Deficiency of other specified B group vitamins: Secondary | ICD-10-CM | POA: Diagnosis not present

## 2020-03-09 DIAGNOSIS — E6609 Other obesity due to excess calories: Secondary | ICD-10-CM

## 2020-03-09 DIAGNOSIS — Z6830 Body mass index (BMI) 30.0-30.9, adult: Secondary | ICD-10-CM

## 2020-03-09 DIAGNOSIS — I10 Essential (primary) hypertension: Secondary | ICD-10-CM

## 2020-03-09 DIAGNOSIS — D509 Iron deficiency anemia, unspecified: Secondary | ICD-10-CM

## 2020-03-09 DIAGNOSIS — Z23 Encounter for immunization: Secondary | ICD-10-CM

## 2020-03-09 DIAGNOSIS — E785 Hyperlipidemia, unspecified: Secondary | ICD-10-CM

## 2020-03-09 MED ORDER — TETANUS-DIPHTH-ACELL PERTUSSIS 5-2-15.5 LF-MCG/0.5 IM SUSP: 0.5000 mL | Freq: Once | INTRAMUSCULAR | 0 refills | Status: AC

## 2020-03-09 MED ORDER — VITAMIN C 1000 MG PO TABS: 1000.0000 mg | ORAL_TABLET | Freq: Every day | ORAL | 0 refills | Status: AC

## 2020-03-09 NOTE — Progress Notes (Signed)
Provider: Marlowe Sax FNP-C   Tarig Zimmers, Nelda Bucks, NP  Patient Care Team: Deane Wattenbarger, Nelda Bucks, NP as PCP - General (Family Medicine)  Extended Emergency Contact Information Primary Emergency Contact: Clark,Catherine Address: [address]          [place], [place] [address] Johnnette Litter of [place] Phone: [phone] Mobile Phone: [phone] Relation: Mother Secondary Emergency Contact: Davina Poke [place] of [place] Mobile Phone: [phone] Relation: Sister  Code Status: Full code  Goals of care: Advanced Directive information Advanced Directives 03/09/2020  Does Patient Have a Medical Advance Directive? No  Type of Advance Directive -  Copy of [place] in Chart? -  Would patient like information on creating a medical advance directive? No - Patient declined     Chief Complaint  Patient presents with   Medical Management of Chronic Issues    6 Month Follow Up.   Health Maintenance    Discuss the need for Hepatitis C Screening.   Immunizations    Discuss the need for Influenza Vaccine, and Tetanus Vaccine.    HPI:  Pt is a 49 y.o. female seen today for  6 months follow up for medical management of chronic diseases.she is here with her mother.she denies any acute issues this visit.she has a medical history of Hypertension,Hyperlipidemia,Anemia ,Overactive bladder,alleric Rhinitis,Alopecia among others. . States was seen by her Gynecologist at [facility] for pap smear,Depo injection and had a mammogram.was notified that her breast were density thought a spot was seen but repeat U/S was normal.  She did not get her Tdap vaccine at her pharmacy as previously advised on previous visit.she declines influenza vaccine this visit.Mother states would like her to get her COVID-19 booster vaccine 2-3 weeks.No Flu shot this year.   Her recent lab work reviewed and discussed during visit. LDL has improved compared to previous level  Chol 175,TRG 43,113,previous 114   Glucose was slightly high 108 but has improved compared to previous 121 and 135 Has modified her diet to more plant based not eating red meat.Just eats baked fish and chicken. Includes nuts and veggie in her diet.  Her renal function also has improved 1.10 previous 1.18;1.29 states drinks plenty of water now.  Hemoglobin is within normal range but indices are high.MCV 109.2 ,MCH36.6     Past Medical History:  Diagnosis Date   Hyperlipidemia    Overactive bladder    Recurrent UTI    Past Surgical History:  Procedure Laterality Date   uretral stent      No Known Allergies  Allergies as of 03/09/2020   No Known Allergies     Medication List       Accurate as of March 09, 2020  9:05 AM. If you have any questions, ask your nurse or doctor.        Cholecalciferol 50 MCG (2000 UT) Caps Take 2,000 Units by mouth daily.   loratadine 10 MG tablet Commonly known as: CLARITIN TAKE 1 TABLET BY MOUTH EVERY DAY   medroxyPROGESTERone 150 MG/ML injection Commonly known as: DEPO-PROVERA Inject 1 mL (150 mg total) into the muscle every 3 (three) months.   metoprolol tartrate 50 MG tablet Commonly known as: LOPRESSOR TAKE 1 TABLET BY MOUTH TWICE A DAY   multivitamin-iron-minerals-folic acid chewable tablet Chew 2 tablets by mouth daily.       Review of Systems  Constitutional: Negative for appetite change, chills, fatigue and fever.  HENT: Negative for congestion, rhinorrhea, sinus pressure, sinus pain, sneezing,  sore throat and trouble swallowing.   Eyes: Positive for visual disturbance. Negative for pain, discharge and itching.       Wears eye glasses has new glasses  Respiratory: Negative for cough, chest tightness, shortness of breath and wheezing.   Cardiovascular: Negative for chest pain, palpitations and leg swelling.  Gastrointestinal: Negative for abdominal distention, abdominal pain, constipation, diarrhea, nausea and  vomiting.  Endocrine: Negative for cold intolerance, heat intolerance, polydipsia, polyphagia and polyuria.  Genitourinary: Negative for difficulty urinating, dysuria, flank pain, frequency and urgency.       On Depo injection. F/u with Gyn   Musculoskeletal: Negative for arthralgias, back pain and gait problem.  Skin: Negative for color change, pallor and rash.  Neurological: Negative for dizziness, speech difficulty, weakness, light-headedness, numbness and headaches.  Hematological: Does not bruise/bleed easily.  Psychiatric/Behavioral: Negative for agitation, behavioral problems, confusion and sleep disturbance. The patient is not nervous/anxious.     Immunization History  Administered Date(s) Administered   Influenza,inj,Quad PF,6+ Mos 03/31/2015, 12/05/2015, 03/12/2017, 01/01/2018, 12/25/2018   PFIZER SARS-COV-2 Vaccination 07/18/2019, 08/10/2019   PPD Test 05/31/2014   Pertinent  Health Maintenance Due  Topic Date Due   INFLUENZA VACCINE  10/25/2019   MAMMOGRAM  12/12/2019   PAP SMEAR-Modifier  12/14/2021   Fall Risk  03/09/2020 09/02/2019 04/24/2019 10/03/2018 01/01/2018  Falls in the past year? 0 0 0 0 No  Number falls in past yr: 0 0 0 0 -  Injury with Fall? 0 0 0 0 -   Functional Status Survey:    Vitals:   03/09/20 0855  BP: 130/84  Pulse: 84  Resp: 16  Temp: (!) 97.5 F (36.4 C)  SpO2: 93%  Weight: 179 lb 12.8 oz (81.6 kg)  Height: '5\' 4"'  (1.626 m)   Body mass index is 30.86 kg/m. Physical Exam Vitals reviewed.  Constitutional:      General: She is not in acute distress.    Appearance: She is obese. She is not ill-appearing.  HENT:     Head: Normocephalic.     Right Ear: Tympanic membrane, ear canal and external ear normal. There is no impacted cerumen.     Left Ear: Tympanic membrane, ear canal and external ear normal. There is no impacted cerumen.     Nose: Nose normal. No congestion or rhinorrhea.     Mouth/Throat:     Mouth: Mucous membranes are  moist.     Pharynx: Oropharynx is clear. No oropharyngeal exudate or posterior oropharyngeal erythema.  Eyes:     General: No scleral icterus.       Right eye: No discharge.        Left eye: No discharge.     Extraocular Movements: Extraocular movements intact.     Conjunctiva/sclera: Conjunctivae normal.     Pupils: Pupils are equal, round, and reactive to light.  Neck:     Vascular: No carotid bruit.  Cardiovascular:     Rate and Rhythm: Normal rate and regular rhythm.     Pulses: Normal pulses.     Heart sounds: Normal heart sounds. No murmur heard. No friction rub. No gallop.   Pulmonary:     Effort: Pulmonary effort is normal. No respiratory distress.     Breath sounds: Normal breath sounds. No wheezing, rhonchi or rales.  Chest:     Chest wall: No tenderness.  Abdominal:     General: Bowel sounds are normal. There is no distension.     Palpations: Abdomen is soft. There is  no mass.     Tenderness: There is no abdominal tenderness. There is no right CVA tenderness, left CVA tenderness, guarding or rebound.  Musculoskeletal:        General: No swelling or tenderness. Normal range of motion.     Cervical back: Normal range of motion. No rigidity or tenderness.     Right lower leg: No edema.     Left lower leg: No edema.  Lymphadenopathy:     Cervical: No cervical adenopathy.  Skin:    General: Skin is warm and dry.     Coloration: Skin is not pale.     Findings: No bruising, erythema, lesion or rash.  Neurological:     Mental Status: She is alert and oriented to person, place, and time.     Cranial Nerves: No cranial nerve deficit.     Sensory: No sensory deficit.     Motor: No weakness.     Coordination: Coordination normal.     Gait: Gait normal.  Psychiatric:        Mood and Affect: Mood normal.        Behavior: Behavior normal.        Thought Content: Thought content normal.        Judgment: Judgment normal.     Labs reviewed: Recent Labs    08/26/19 0822  03/02/20 0912  NA 140 140  K 4.1 4.1  CL 107 109  CO2 27 25  GLUCOSE 121* 108*  BUN 16 14  CREATININE 1.18* 1.10  CALCIUM 9.8 9.6   Recent Labs    08/26/19 0822 03/02/20 0912  AST 17 15  ALT 18 12  BILITOT 1.5* 1.6*  PROT 6.7 6.7   Recent Labs    08/26/19 0822 03/02/20 0912  WBC 5.0 4.9  NEUTROABS 2,515 2,352  HGB 12.5 12.7  HCT 37.5 37.9  MCV 109.6* 109.2*  PLT 239 201   Lab Results  Component Value Date   TSH 1.24 03/02/2020   Lab Results  Component Value Date   HGBA1C 5.0 03/02/2020   Lab Results  Component Value Date   CHOL 175 03/02/2020   HDL 49 (L) 03/02/2020   LDLCALC 113 (H) 03/02/2020   TRIG 43 03/02/2020   CHOLHDL 3.6 03/02/2020    Significant Diagnostic Results in last 30 days:  No results found.  Assessment/Plan 1. Essential hypertension B/p well controlled. - continue on metoprolol  - stopped statin due to hair thinning  - continue on dietary modification and exercise.  - CBC with Differential/Platelet; Future - CMP with eGFR([facility]); Future - TSH; Future  2. Hyperlipidemia, unspecified hyperlipidemia type LDL not at goal but has improved.  Off statin due to thinning of her hair per mother stopped after reading sides effects of statin.  - Lipid panel; Future  3. Iron deficiency anemia, unspecified iron deficiency anemia type Hgb normal but indices are high possible macrocytic anemia.will rule out with next lab work.  - continue on MVI daily.   4. Vitamin B12 deficiency Normal Hgb with high indices as above.Advised to take Vitamin B12 1000 mcg tablet daily then will recheck Vitamin B12 level with lab work.  - Vitamin B12; Future  5. Need for Tdap vaccination Did not get Tdap vaccine at her pharmacy as directed on previous visit.Discussed with patient's mother to get Tdap at her pharmacy.Would like to wait until she completes her COVID-19 booster then will take her to the pharmacy for Tdap vaccine.  - Tdap (ADACEL) 07-25-13.5  LF-MCG/0.5 injection; Inject 0.5 mLs into the muscle once for 1 dose.  Dispense: 0.5 mL; Refill: 0  6. Encounter for hepatitis C screening test for low risk patient Low risk.  - Hep C Antibody; Future  7. Body mass index (BMI) of 30.0-30.9 in adult BMI 30.86 previous was 31.55 has lost 4 lbs over 6 months.Congratulated this visit.Has modified her diet and exercise. - Encouraged to continue with dietary modification and exercise.   8. Class 1 obesity due to excess calories without serious comorbidity with body mass index (BMI) of 30.0 to 30.9 in adult Continue with dietary modification and exercise as above.Patient is very motivated with the mother.they exercise together in the Gym.   Family/ staff Communication: Reviewed plan of care with patient and Mother.   Labs/tests ordered:  - CBC with Differential/Platelet; Future - CMP with eGFR([facility]); Future - TSH; Future - Lipid panel; Future - Hep C Antibody; Future - Vitamin B12; Future  Next Appointment : 6 months for Annual Physical Exam with fasting labs 2-4 days prior to visit.   Sandrea Hughs, NP

## 2020-04-22 ENCOUNTER — Telehealth: Payer: Self-pay | Admitting: Family

## 2020-04-22 NOTE — Telephone Encounter (Signed)
Patient called back. Appt is scheduled.  KM

## 2020-04-22 NOTE — Telephone Encounter (Signed)
Called both phones to schedule an AWV. Someone answered and hung up. I left messages on both phones to call back so that we can get them scheduled.  KM

## 2020-04-28 ENCOUNTER — Ambulatory Visit: Payer: Medicare HMO | Admitting: Family

## 2020-04-29 ENCOUNTER — Ambulatory Visit (INDEPENDENT_AMBULATORY_CARE_PROVIDER_SITE_OTHER): Payer: Medicare HMO | Admitting: Family

## 2020-04-29 ENCOUNTER — Other Ambulatory Visit: Payer: Self-pay

## 2020-04-29 ENCOUNTER — Telehealth: Payer: Self-pay | Admitting: Family

## 2020-04-29 ENCOUNTER — Encounter: Payer: Self-pay | Admitting: Family

## 2020-04-29 ENCOUNTER — Telehealth: Payer: Self-pay

## 2020-04-29 DIAGNOSIS — Z0000 Encounter for general adult medical examination without abnormal findings: Secondary | ICD-10-CM | POA: Diagnosis not present

## 2020-04-29 DIAGNOSIS — Z23 Encounter for immunization: Secondary | ICD-10-CM | POA: Diagnosis not present

## 2020-04-29 DIAGNOSIS — Z1211 Encounter for screening for malignant neoplasm of colon: Secondary | ICD-10-CM | POA: Diagnosis not present

## 2020-04-29 MED ORDER — TETANUS-DIPHTH-ACELL PERTUSSIS 5-2-15.5 LF-MCG/0.5 IM SUSP: 0.5000 mL | Freq: Once | INTRAMUSCULAR | 0 refills | Status: AC

## 2020-04-29 NOTE — Patient Instructions (Signed)
Christine Terrell , Thank you for taking time to come for your Medicare Wellness Visit. I appreciate your ongoing commitment to your health goals. Please review the following plan we discussed and let me know if I can assist you in the future.   Screening recommendations/referrals: Colonoscopy: Ordered today specialist office will call you for appointment  Mammogram: Up to date  Bone Density: : Up to date  Recommended yearly ophthalmology/optometry visit for glaucoma screening and checkup Recommended yearly dental visit for hygiene and checkup  Vaccinations: Influenza vaccine: Up to date  Pneumococcal vaccine Tdap vaccine: Please get Tdap vaccine at your pharmacy  Shingles vaccine  COVID vaccine:Up to date   Advanced directives: No   Conditions/risks identified: Hypertension,Obesity BMI > 30   Next appointment: 1 year   Preventive Care 40-64 Years, Female Preventive care refers to lifestyle choices and visits with your health care provider that can promote health and wellness. What does preventive care include?  A yearly physical exam. This is also called an annual well check.  Dental exams once or twice a year.  Routine eye exams. Ask your health care provider how often you should have your eyes checked.  Personal lifestyle choices, including:  Daily care of your teeth and gums.  Regular physical activity.  Eating a healthy diet.  Avoiding tobacco and drug use.  Limiting alcohol use.  Practicing safe sex.  Taking low-dose aspirin daily starting at age 50.  Taking vitamin and mineral supplements as recommended by your health care provider. What happens during an annual well check? The services and screenings done by your health care provider during your annual well check will depend on your age, overall health, lifestyle risk factors, and family history of disease. Counseling  Your health care provider may ask you questions about your:  Alcohol use.  Tobacco  use.  Drug use.  Emotional well-being.  Home and relationship well-being.  Sexual activity.  Eating habits.  Work and work environment.  Method of birth control.  Menstrual cycle.  Pregnancy history. Screening  You may have the following tests or measurements:  Height, weight, and BMI.  Blood pressure.  Lipid and cholesterol levels. These may be checked every 5 years, or more frequently if you are over 50 years old.  Skin check.  Lung cancer screening. You may have this screening every year starting at age 55 if you have a 30-pack-year history of smoking and currently smoke or have quit within the past 15 years.  Fecal occult blood test (FOBT) of the stool. You may have this test every year starting at age 50.  Flexible sigmoidoscopy or colonoscopy. You may have a sigmoidoscopy every 5 years or a colonoscopy every 10 years starting at age 50.  Hepatitis C blood test.  Hepatitis B blood test.  Sexually transmitted disease (STD) testing.  Diabetes screening. This is done by checking your blood sugar (glucose) after you have not eaten for a while (fasting). You may have this done every 1-3 years.  Mammogram. This may be done every 1-2 years. Talk to your health care provider about when you should start having regular mammograms. This may depend on whether you have a family history of breast cancer.  BRCA-related cancer screening. This may be done if you have a family history of breast, ovarian, tubal, or peritoneal cancers.  Pelvic exam and Pap test. This may be done every 3 years starting at age 21. Starting at age 30, this may be done every 5 years if   you have a Pap test in combination with an HPV test.  Bone density scan. This is done to screen for osteoporosis. You may have this scan if you are at high risk for osteoporosis. Discuss your test results, treatment options, and if necessary, the need for more tests with your health care provider. Vaccines  Your  health care provider may recommend certain vaccines, such as:  Influenza vaccine. This is recommended every year.  Tetanus, diphtheria, and acellular pertussis (Tdap, Td) vaccine. You may need a Td booster every 10 years.  Zoster vaccine. You may need this after age 60.  Pneumococcal 13-valent conjugate (PCV13) vaccine. You may need this if you have certain conditions and were not previously vaccinated.  Pneumococcal polysaccharide (PPSV23) vaccine. You may need one or two doses if you smoke cigarettes or if you have certain conditions. Talk to your health care provider about which screenings and vaccines you need and how often you need them. This information is not intended to replace advice given to you by your health care provider. Make sure you discuss any questions you have with your health care provider. Document Released: 04/08/2015 Document Revised: 11/30/2015 Document Reviewed: 01/11/2015 Elsevier Interactive Patient Education  2017 [employer]    Fall Prevention in the Home Falls can cause injuries. They can happen to people of all ages. There are many things you can do to make your home safe and to help prevent falls. What can I do on the outside of my home?  Regularly fix the edges of walkways and driveways and fix any cracks.  Remove anything that might make you trip as you walk through a door, such as a raised step or threshold.  Trim any bushes or trees on the path to your home.  Use bright outdoor lighting.  Clear any walking paths of anything that might make someone trip, such as rocks or tools.  Regularly check to see if handrails are loose or broken. Make sure that both sides of any steps have handrails.  Any raised decks and porches should have guardrails on the edges.  Have any leaves, snow, or ice cleared regularly.  Use sand or salt on walking paths during winter.  Clean up any spills in your garage right away. This includes oil or grease  spills. What can I do in the bathroom?  Use night lights.  Install grab bars by the toilet and in the tub and shower. Do not use towel bars as grab bars.  Use non-skid mats or decals in the tub or shower.  If you need to sit down in the shower, use a plastic, non-slip stool.  Keep the floor dry. Clean up any water that spills on the floor as soon as it happens.  Remove soap buildup in the tub or shower regularly.  Attach bath mats securely with double-sided non-slip rug tape.  Do not have throw rugs and other things on the floor that can make you trip. What can I do in the bedroom?  Use night lights.  Make sure that you have a light by your bed that is easy to reach.  Do not use any sheets or blankets that are too big for your bed. They should not hang down onto the floor.  Have a firm chair that has side arms. You can use this for support while you get dressed.  Do not have throw rugs and other things on the floor that can make you trip. What can I do in the   kitchen?  Clean up any spills right away.  Avoid walking on wet floors.  Keep items that you use a lot in easy-to-reach places.  If you need to reach something above you, use a strong step stool that has a grab bar.  Keep electrical cords out of the way.  Do not use floor polish or wax that makes floors slippery. If you must use wax, use non-skid floor wax.  Do not have throw rugs and other things on the floor that can make you trip. What can I do with my stairs?  Do not leave any items on the stairs.  Make sure that there are handrails on both sides of the stairs and use them. Fix handrails that are broken or loose. Make sure that handrails are as long as the stairways.  Check any carpeting to make sure that it is firmly attached to the stairs. Fix any carpet that is loose or worn.  Avoid having throw rugs at the top or bottom of the stairs. If you do have throw rugs, attach them to the floor with carpet  tape.  Make sure that you have a light switch at the top of the stairs and the bottom of the stairs. If you do not have them, ask someone to add them for you. What else can I do to help prevent falls?  Wear shoes that:  Do not have high heels.  Have rubber bottoms.  Are comfortable and fit you well.  Are closed at the toe. Do not wear sandals.  If you use a stepladder:  Make sure that it is fully opened. Do not climb a closed stepladder.  Make sure that both sides of the stepladder are locked into place.  Ask someone to hold it for you, if possible.  Clearly mark and make sure that you can see:  Any grab bars or handrails.  First and last steps.  Where the edge of each step is.  Use tools that help you move around (mobility aids) if they are needed. These include:  Canes.  Walkers.  Scooters.  Crutches.  Turn on the lights when you go into a dark area. Replace any light bulbs as soon as they burn out.  Set up your furniture so you have a clear path. Avoid moving your furniture around.  If any of your floors are uneven, fix them.  If there are any pets around you, be aware of where they are.  Review your medicines with your doctor. Some medicines can make you feel dizzy. This can increase your chance of falling. Ask your doctor what other things that you can do to help prevent falls. This information is not intended to replace advice given to you by your health care provider. Make sure you discuss any questions you have with your health care provider. Document Released: 01/06/2009 Document Revised: 08/18/2015 Document Reviewed: 04/16/2014 Elsevier Interactive Patient Education  2017 [employer].

## 2020-04-29 NOTE — Progress Notes (Signed)
This service is provided via telemedicine  No vital signs collected/recorded due to the encounter was a telemedicine visit.   Location of patient (ex: home, work): Home.  Patient consents to a telephone visit: Yes.  Location of the provider (ex: office, home): [facility].  Name of any referring provider: Taneisha Fuson, Nelda Bucks, NP   Names of all persons participating in the telemedicine service and their role in the encounter: Patient, Heriberto Antigua, [place], [place], Webb Silversmith, NP.    Time spent on call: 8 minutes spent on the phone with Medical Assistant.     Subjective:   Christine Terrell is a 50 y.o. female who presents for Medicare Annual (Subsequent) preventive examination.  Review of Systems     Cardiac Risk Factors include: obesity (BMI >30kg/m2);hypertension     Objective:    There were no vitals filed for this visit. There is no height or weight on file to calculate BMI.  Advanced Directives 04/29/2020 03/09/2020 09/02/2019 04/24/2019 09/17/2016 05/29/2016 05/01/2016  Does Patient Have a Medical Advance Directive? No No No No No No No  Type of Advance Directive - - - - - - -  Does patient want to make changes to medical advance directive? No - Patient declined - - - - - -  Copy of Press photographer in [place]  Would patient like information on creating a medical advance directive? - No - Patient declined No - Patient declined No - Patient declined - - -    Current Medications (verified) Outpatient Encounter Medications as of 04/29/2020  Medication Sig  . Ascorbic Acid (VITAMIN C) 1000 MG tablet Take 1 tablet (1,000 mg total) by mouth daily.  . Cholecalciferol 50 MCG (2000 UT) CAPS Take 2,000 Units by mouth daily.  Marland Kitchen loratadine (CLARITIN) 10 MG tablet TAKE 1 TABLET BY MOUTH EVERY DAY  . medroxyPROGESTERone (DEPO-PROVERA) 150 MG/ML injection Inject 1 mL (150 mg total) into the muscle every 3 (three) months.  . metoprolol tartrate (LOPRESSOR) 50 MG  tablet TAKE 1 TABLET BY MOUTH TWICE A DAY  . multivitamin-iron-minerals-folic acid (CENTRUM) chewable tablet Chew 2 tablets by mouth daily.   No facility-administered encounter medications on file as of 04/29/2020.    Allergies (verified) Patient has no known allergies.   History: Past Medical History:  Diagnosis Date  . Hyperlipidemia   . Overactive bladder   . Recurrent UTI    Past Surgical History:  Procedure Laterality Date  . uretral stent     Family History  Problem Relation Age of Onset  . Hypertension Mother   . Arthritis Mother   . Cancer Father 76       stomach   Social History   Socioeconomic History  . Marital status: Single    Spouse name: Not on file  . Number of children: Not on file  . Years of education: Not on file  . Highest education level: Not on file  Occupational History  . Not on file  Tobacco Use  . Smoking status: Never Smoker  . Smokeless tobacco: Never Used  Vaping Use  . Vaping Use: Never used  Substance and Sexual Activity  . Alcohol use: No  . Drug use: No  . Sexual activity: Never  Other Topics Concern  . Not on file  Social History Narrative   Diet:   Do you drink/eat things with caffeine?    Marital status: Single  What year were you married?   Do you live in a house, apartment, assisted living, condo, trailer, etc)? House   Is it one or more stories? No   How many persons live in your home? 2   Do you have any pets in your home? No   Current or past profession: [employer] copy)   Do you exercise?   A little                                                  Type & how often: Walking   Do you have a living will? No   Do you have a DNR Form? No   Do you have a POA/HPOA forms? No         Patient's caretaker reports unprotected sexual intercourse with multiple partners.    Social Determinants of Health   Financial Resource Strain: Not on file  Food Insecurity: Not on file   Transportation Needs: Not on file  Physical Activity: Not on file  Stress: Not on file  Social Connections: Not on file    Tobacco Counseling Counseling given: Not Answered   Clinical Intake:  Pre-visit preparation completed: No  Pain : No/denies pain     BMI - recorded: 30.86 Nutritional Risks: None Diabetes: No  How often do you need to have someone help you when you read instructions, pamphlets, or other written materials from your doctor or pharmacy?: 5 - Always What is the last grade level you completed in school?: special School diploma High school 12 grade  Diabetic?no  Interpreter Needed?: No  Information entered by :: Saif Peter FNP-C   Activities of Daily Living In your present state of health, do you have any difficulty performing the following activities: 04/29/2020  Hearing? N  Vision? N  Difficulty concentrating or making decisions? N  Walking or climbing stairs? N  Dressing or bathing? N  Doing errands, shopping? N  Preparing Food and eating ? N  Using the Toilet? N  In the past six months, have you accidently leaked urine? N  Do you have problems with loss of bowel control? N  Managing your Medications? N  Housekeeping or managing your Housekeeping? N  Some recent data might be hidden    Patient Care Team: Jeanenne Licea, Donalee Citrin, NP as PCP - General (Family Medicine)  Indicate any recent Medical Services you may have received from other than [facility] providers in the past year (date may be approximate).     Assessment:   This is a routine wellness examination for [name].  Hearing/Vision screen  Hearing Screening   125Hz  250Hz  500Hz  1000Hz  2000Hz  3000Hz  4000Hz  6000Hz  8000Hz   Right ear:           Left ear:           Comments: No Hearing Concerns.  Vision Screening Comments: No Vision Concerns. Patient wears prescription glasses. Last eye exam was November 2021.  Dietary issues and exercise activities discussed: Current Exercise Habits: Home  exercise routine, Type of exercise: strength training/weights, Time (Minutes): 60, Frequency (Times/Week): 3, Weekly Exercise (Minutes/Week): 180, Intensity: Moderate, Exercise limited by: None identified  Goals    . Patient Stated     Exercise three days per week       Depression Screen [facility] 2/9 Scores 04/29/2020 04/24/2019 10/03/2018 03/12/2017 03/31/2015  PHQ -  2 Score 0 0 0 0 0    Fall Risk Fall Risk  04/29/2020 03/09/2020 09/02/2019 04/24/2019 10/03/2018  Falls in the past year? 0 0 0 0 0  Number falls in past yr: 0 0 0 0 0  Injury with Fall? 0 0 0 0 0    FALL RISK PREVENTION PERTAINING TO THE HOME:  Any stairs in or around the home? No  If so, are there any without handrails? No  Home free of loose throw rugs in walkways, pet beds, electrical cords, etc? No  Adequate lighting in your home to reduce risk of falls? Yes   ASSISTIVE DEVICES UTILIZED TO PREVENT FALLS:  Life alert? No  Use of a cane, walker or w/c? No  Grab bars in the bathroom? No  Shower chair or bench in shower? No  Elevated toilet seat or a handicapped toilet? No   TIMED UP AND GO:  Was the test performed? No .  Length of time to ambulate 10 feet: N/A  sec.   Gait steady and fast without use of assistive device  Cognitive Function:     6CIT Screen 04/29/2020  What Year? 0 points  What month? 0 points  What time? 0 points  Count back from 20 4 points  Months in reverse 4 points  Repeat phrase 10 points  Total Score 18    Immunizations Immunization History  Administered Date(s) Administered  . Influenza,inj,Quad PF,6+ Mos 03/31/2015, 12/05/2015, 03/12/2017, 01/01/2018, 12/25/2018, 03/20/2020  . PFIZER(Purple Top)SARS-COV-2 Vaccination 07/18/2019, 08/10/2019, 04/03/2020  . PPD Test 05/31/2014    TDAP status: Due, Education has been provided regarding the importance of this vaccine. Advised may receive this vaccine at local pharmacy or Health Dept. Aware to provide a copy of the vaccination record if  obtained from local pharmacy or Health Dept. Verbalized acceptance and understanding.  Flu Vaccine status: Up to date   Covid-19 vaccine status: Completed vaccines  Qualifies for Shingles Vaccine? No   Zostavax completed No    Screening Tests Health Maintenance  Topic Date Due  . Hepatitis C Screening  Never done  . TETANUS/TDAP  Never done  . COLONOSCOPY (Pts 45-71yrs Insurance coverage will need to be confirmed)  Never done  . MAMMOGRAM  01/14/2021  . PAP SMEAR-Modifier  12/14/2021  . INFLUENZA VACCINE  Completed  . COVID-19 Vaccine  Completed  . HIV Screening  Completed    Health Maintenance  Health Maintenance Due  Topic Date Due  . Hepatitis C Screening  Never done  . TETANUS/TDAP  Never done  . COLONOSCOPY (Pts 45-69yrs Insurance coverage will need to be confirmed)  Never done    Colorectal cancer screening: Referral to GI placed 04/29/2020. Pt aware the office will call re: appt.  Mammogram status: Completed 01/15/2020 . Repeat every year  Bone Density status: Ordered N/A . Pt provided with contact info and advised to call to schedule appt.  Lung Cancer Screening: (Low Dose CT Chest recommended if Age 16-80 years, 30 pack-year currently smoking OR have quit w/in 15years.) does not qualify.   Lung Cancer Screening Referral: No   Additional Screening:  Hepatitis C Screening: does qualify; Completed ordered today   Vision Screening: Recommended annual ophthalmology exams for early detection of glaucoma and other disorders of the eye. Is the patient up to date with their annual eye exam?  Yes  Who is the provider or what is the name of the office in which the patient attends annual eye exams? [facility]  on [address]  If pt is not established with a provider, would they like to be referred to a provider to establish care? No .   Dental Screening: Recommended annual dental exams for proper oral hygiene  Community Resource Referral / Chronic Care Management: CRR  required this visit?  No   CCM required this visit?  No     Plan:   - Tdap Vaccine  - Colonoscopy   I have personally reviewed and noted the following in the patient's chart:   . Medical and social history . Use of alcohol, tobacco or illicit drugs  . Current medications and supplements . Functional ability and status . Nutritional status . Physical activity . Advanced directives . List of other physicians . Hospitalizations, surgeries, and ER visits in previous 12 months . Vitals . Screenings to include cognitive, depression, and falls . Referrals and appointments  In addition, I have reviewed and discussed with patient certain preventive protocols, quality metrics, and best practice recommendations. A written personalized care plan for preventive services as well as general preventive health recommendations were provided to patient.     Sandrea Hughs, NP   04/29/2020   Nurse Notes:Advised to get Tdap Vaccine at her pharmacy.Referral to GI for colonoscopy made aware GI specialist office will call for appointment.

## 2020-04-29 NOTE — Telephone Encounter (Signed)
Called to set up AWV for next year, mother said she would call back to schedule. KM

## 2020-04-29 NOTE — Telephone Encounter (Signed)
Noted  

## 2020-04-29 NOTE — Telephone Encounter (Signed)
Ms. naida, escalante are scheduled for a virtual visit with your provider today.    Just as we do with appointments in the office, we must obtain your consent to participate.  Your consent will be active for this visit and any virtual visit you may have with one of our providers in the next 365 days.    If you have a MyChart account, I can also send a copy of this consent to you electronically.  All virtual visits are billed to your insurance company just like a traditional visit in the office.  As this is a virtual visit, video technology does not allow for your provider to perform a traditional examination.  This may limit your provider's ability to fully assess your condition.  If your provider identifies any concerns that need to be evaluated in person or the need to arrange testing such as labs, EKG, etc, we will make arrangements to do so.    Although advances in technology are sophisticated, we cannot ensure that it will always work on either your end or our end.  If the connection with a video visit is poor, we may have to switch to a telephone visit.  With either a video or telephone visit, we are not always able to ensure that we have a secure connection.   I need to obtain your verbal consent now.   Are you willing to proceed with your visit today?   Liya Strollo has provided verbal consent on 04/29/2020 for a virtual visit (video or telephone).   [place], [place] 04/29/2020  10:32 AM

## 2020-05-16 ENCOUNTER — Encounter: Payer: Self-pay | Admitting: Gastroenterology

## 2020-05-20 DIAGNOSIS — Z3042 Encounter for surveillance of injectable contraceptive: Secondary | ICD-10-CM | POA: Diagnosis not present

## 2020-06-02 ENCOUNTER — Other Ambulatory Visit: Payer: Self-pay | Admitting: Family

## 2020-06-02 DIAGNOSIS — D509 Iron deficiency anemia, unspecified: Secondary | ICD-10-CM

## 2020-06-13 ENCOUNTER — Other Ambulatory Visit: Payer: Self-pay

## 2020-06-13 DIAGNOSIS — I10 Essential (primary) hypertension: Secondary | ICD-10-CM

## 2020-06-13 DIAGNOSIS — R000 Tachycardia, unspecified: Secondary | ICD-10-CM

## 2020-06-13 MED ORDER — METOPROLOL TARTRATE 50 MG PO TABS: 50.0000 mg | ORAL_TABLET | Freq: Two times a day (BID) | ORAL | 1 refills | Status: AC

## 2020-06-13 NOTE — Telephone Encounter (Signed)
Patient mother called and states that daughter needs medication "Metoprolol 50 mg". She also states that daughter is suppose to be moving soon and she will sign records of release when things are settled.

## 2020-07-27 ENCOUNTER — Encounter: Payer: Medicare HMO | Admitting: Gastroenterology

## 2020-11-03 ENCOUNTER — Telehealth: Payer: Self-pay | Admitting: *Deleted

## 2020-11-03 MED ORDER — MEDROXYPROGESTERONE ACETATE 150 MG/ML IM SUSP: 150.0000 mg | INTRAMUSCULAR | 0 refills | Status: AC

## 2020-11-03 NOTE — Telephone Encounter (Signed)
Barnetta Chapel Notified that Rx has been faxed.

## 2020-11-03 NOTE — Telephone Encounter (Signed)
Fax  Depo Provera Inject script to requested facility

## 2020-11-03 NOTE — Telephone Encounter (Signed)
Barnetta Chapel, Mother, called and stated that patient is moving to [place] into a Program at [facility] on [address].   Jacklynn Bue [phone] at the facility is requesting a copy of a Rx for patient's Depo Provera Injection to put in patient's file at the facility.   Wants it faxed to [phone]  Pended Rx to print and sign upon approval.  Please Advise.

## 2020-11-03 NOTE — Telephone Encounter (Signed)
Prescription paper faxed to [phone] and placed into Clinical Intake mailbox for reference. Message routed to PCP [place], Nelda Bucks, NP and Clinical Intake Rafael Bihari, [place]

## 2021-01-24 ENCOUNTER — Other Ambulatory Visit: Payer: Self-pay | Admitting: *Deleted

## 2021-01-24 NOTE — Telephone Encounter (Signed)
Received refill request from [facility].  Patient has signed a Release of Records, sending her records to [place], [place] ([place]) on 11/02/2020

## 2021-05-03 ENCOUNTER — Telehealth: Payer: Self-pay | Admitting: *Deleted

## 2021-05-03 NOTE — Telephone Encounter (Signed)
LMOM for Christine Terrell to return call.

## 2021-05-03 NOTE — Telephone Encounter (Signed)
Renea Ee with [facility] called and stated that patient needs a refill on her Depo Provera Birth Control.   I informed her that this patient was no longer our patient and she stated that they are in the process of getting her set up with another PCP and GYN. Wanted to know if you would refill it ONE MORE TIME to [facility].   Please Advise.

## 2021-05-03 NOTE — Telephone Encounter (Signed)
Patient has not been seen at the office since 03/09/2020.need visit to refill medication.

## 2021-05-03 NOTE — Telephone Encounter (Signed)
Danielle with [facility]. She stated that she will try to get patient set up for an appointment there.

## 2021-05-04 ENCOUNTER — Ambulatory Visit: Payer: Medicare HMO | Admitting: Family
